# Patient Record
Sex: Female | Born: 1985 | Race: White | Hispanic: No | Marital: Married | Smoking: Former smoker
Health system: Southern US, Community
[De-identification: ages and names within clinical notes are randomized; demographics above are authoritative.]

## PROBLEM LIST (undated history)

## (undated) DIAGNOSIS — D649 Anemia, unspecified: Secondary | ICD-10-CM

## (undated) DIAGNOSIS — E669 Obesity, unspecified: Secondary | ICD-10-CM

## (undated) DIAGNOSIS — E282 Polycystic ovarian syndrome: Secondary | ICD-10-CM

## (undated) DIAGNOSIS — N390 Urinary tract infection, site not specified: Secondary | ICD-10-CM

## (undated) DIAGNOSIS — Z8719 Personal history of other diseases of the digestive system: Secondary | ICD-10-CM

## (undated) DIAGNOSIS — F419 Anxiety disorder, unspecified: Secondary | ICD-10-CM

## (undated) HISTORY — DX: Urinary tract infection, site not specified: N39.0

## (undated) HISTORY — DX: Obesity, unspecified: E66.9

## (undated) HISTORY — PX: WISDOM TOOTH EXTRACTION: SHX21

## (undated) HISTORY — DX: Anxiety disorder, unspecified: F41.9

## (undated) HISTORY — DX: Personal history of other diseases of the digestive system: Z87.19

## (undated) HISTORY — DX: Anemia, unspecified: D64.9

---

## 2005-10-26 ENCOUNTER — Encounter: Payer: Self-pay | Admitting: Emergency Medicine

## 2005-10-27 ENCOUNTER — Inpatient Hospital Stay (HOSPITAL_COMMUNITY): Admission: EM | Admit: 2005-10-27 | Discharge: 2005-10-27 | Payer: Self-pay

## 2017-09-23 ENCOUNTER — Encounter: Payer: Self-pay | Admitting: Obstetrics and Gynecology

## 2017-09-23 ENCOUNTER — Ambulatory Visit (INDEPENDENT_AMBULATORY_CARE_PROVIDER_SITE_OTHER): Payer: BC Managed Care – PPO | Admitting: Obstetrics and Gynecology

## 2017-09-23 VITALS — BP 125/72 | HR 68 | Ht 64.0 in | Wt 202.0 lb

## 2017-09-23 DIAGNOSIS — Z3687 Encounter for antenatal screening for uncertain dates: Secondary | ICD-10-CM | POA: Diagnosis not present

## 2017-09-23 DIAGNOSIS — Z113 Encounter for screening for infections with a predominantly sexual mode of transmission: Secondary | ICD-10-CM | POA: Diagnosis not present

## 2017-09-23 DIAGNOSIS — O34219 Maternal care for unspecified type scar from previous cesarean delivery: Secondary | ICD-10-CM | POA: Insufficient documentation

## 2017-09-23 DIAGNOSIS — Z3481 Encounter for supervision of other normal pregnancy, first trimester: Secondary | ICD-10-CM | POA: Diagnosis not present

## 2017-09-23 DIAGNOSIS — Z3480 Encounter for supervision of other normal pregnancy, unspecified trimester: Secondary | ICD-10-CM | POA: Insufficient documentation

## 2017-09-23 MED ORDER — DOXYLAMINE-PYRIDOXINE 10-10 MG PO TBEC: DELAYED_RELEASE_TABLET | ORAL | 6 refills | Status: DC

## 2017-09-23 NOTE — Progress Notes (Signed)
Last pap smear March 2018- normal results

## 2017-09-23 NOTE — Progress Notes (Signed)
  Subjective:    Joanna Banks is a [id number] 7660w0d being seen today for her first obstetrical visit.  Her obstetrical history is significant for 2 previous full term pregnancy and previous cesarean section followed by TOLAC. Patient does intend to breast feed. Pregnancy history fully reviewed.  Patient reports nausea.  Vitals:   09/23/17 1538 09/23/17 1541  BP: 125/72   Pulse: 68   Weight: 202 lb (91.6 kg)   Height:  5\' 4"  (1.626 m)    HISTORY: OB History  Gravida Para Term Preterm AB Living  3 2 2     2   SAB TAB Ectopic Multiple Live Births               # Outcome Date GA Lbr Len/2nd Weight Sex Delivery Anes PTL Lv  3 Current           2 Term      Vag-Spont     1 Term      CS-LTranv        History reviewed. No pertinent past medical history. Past Surgical History:  Procedure Laterality Date  . CESAREAN SECTION     Family History  Problem Relation Age of Onset  . Breast cancer Maternal Aunt   . Lung cancer Maternal Grandfather   . Lung cancer Paternal Grandfather      Exam    Uterus:   8-weeks  Pelvic Exam:    Perineum: No Hemorrhoids, Normal Perineum   Vulva: normal   Vagina:  normal mucosa, normal discharge   pH:    Cervix: multiparous appearance   Adnexa: normal adnexa and no mass, fullness, tenderness   Bony Pelvis: gynecoid  System: Breast:  normal appearance, no masses or tenderness   Skin: normal coloration and turgor, no rashes    Neurologic: oriented, no focal deficits   Extremities: normal strength, tone, and muscle mass   HEENT extra ocular movement intact   Mouth/Teeth mucous membranes moist, pharynx normal without lesions and dental hygiene good   Neck supple and no masses   Cardiovascular: regular rate and rhythm   Respiratory:  chest clear, no wheezing, crepitations, rhonchi, normal symmetric air entry   Abdomen: soft, non-tender; bowel sounds normal; no masses,  no organomegaly   Urinary:       Assessment:    Pregnancy:  [id number] Patient Active Problem List   Diagnosis Date Noted  . Supervision of other normal pregnancy, antepartum 09/23/2017  . Previous cesarean delivery affecting pregnancy, antepartum 09/23/2017        Plan:     Initial labs drawn. Prenatal vitamins. Problem list reviewed and updated. Genetic Screening discussed First Screen: ordered.  Ultrasound discussed; fetal survey: requested. Patient reports normal pap smear in march 2018- records requested Rx diclegis provided  Follow up in 4 weeks. 50% of 30 min visit spent on counseling and coordination of care.     Shaughn Thomley 09/23/2017

## 2017-09-23 NOTE — Progress Notes (Signed)
Bedside U/S shows IUP with FHT of 147 BPM and CRL 14.478mm  GA 268w5d

## 2017-09-24 LAB — HIV ANTIBODY (ROUTINE TESTING W REFLEX): HIV 1&2 Ab, 4th Generation: NONREACTIVE

## 2017-09-24 LAB — OBSTETRIC PANEL
ANTIBODY SCREEN: NOT DETECTED
BASOS ABS: 52 {cells}/uL (ref 0–200)
Basophils Relative: 0.4 %
EOS PCT: 0.5 %
Eosinophils Absolute: 65 cells/uL (ref 15–500)
HEMATOCRIT: 43.4 % (ref 35.0–45.0)
HEMOGLOBIN: 13.9 g/dL (ref 11.7–15.5)
HEP B S AG: NONREACTIVE
Lymphs Abs: 2903 cells/uL (ref 850–3900)
MCH: 26.6 pg — ABNORMAL LOW (ref 27.0–33.0)
MCHC: 32 g/dL (ref 32.0–36.0)
MCV: 83.1 fL (ref 80.0–100.0)
MONOS PCT: 5.2 %
MPV: 12.2 fL (ref 7.5–12.5)
NEUTROS ABS: 9211 {cells}/uL — AB (ref 1500–7800)
Neutrophils Relative %: 71.4 %
Platelets: 428 10*3/uL — ABNORMAL HIGH (ref 140–400)
RBC: 5.22 10*6/uL — ABNORMAL HIGH (ref 3.80–5.10)
RDW: 13.2 % (ref 11.0–15.0)
RPR Ser Ql: NONREACTIVE
TOTAL LYMPHOCYTE: 22.5 %
WBC mixed population: 671 cells/uL (ref 200–950)
WBC: 12.9 10*3/uL — ABNORMAL HIGH (ref 3.8–10.8)

## 2017-09-24 LAB — HEMOGLOBIN A1C
HEMOGLOBIN A1C: 4.9 %{Hb} (ref <5.7)
Mean Plasma Glucose: 94 (calc)
eAG (mmol/L): 5.2 (calc)

## 2017-09-25 LAB — GC/CHLAMYDIA PROBE AMP (~~LOC~~) NOT AT ARMC
CHLAMYDIA, DNA PROBE: NEGATIVE
Neisseria Gonorrhea: NEGATIVE

## 2017-09-25 LAB — CULTURE, OB URINE

## 2017-09-25 LAB — URINE CULTURE, OB REFLEX: ORGANISM ID, BACTERIA: NO GROWTH

## 2017-09-27 DIAGNOSIS — Z3480 Encounter for supervision of other normal pregnancy, unspecified trimester: Secondary | ICD-10-CM

## 2017-09-27 DIAGNOSIS — O34219 Maternal care for unspecified type scar from previous cesarean delivery: Secondary | ICD-10-CM

## 2017-09-29 ENCOUNTER — Other Ambulatory Visit: Payer: Self-pay | Admitting: Obstetrics and Gynecology

## 2017-09-29 DIAGNOSIS — Z3480 Encounter for supervision of other normal pregnancy, unspecified trimester: Secondary | ICD-10-CM

## 2017-10-19 NOTE — L&D Delivery Note (Addendum)
Delivery Note At 12:49 PM a viable female was delivered via VBAC, Spontaneous (Presentation: Cephalic;OA  ).  APGAR: 7, 9; weight 9 lb 14 oz (4479 g).   Placenta status:Complete with 3 vessel cord. with  3rd degree vaginal tear and some brisk post partum blood loss of >107300ml EBL: .  Cord pH: NA  Anesthesia:   Episiotomy: None Lacerations: 3rd degree (3A) perineal, sulcal Suture Repair: 1-0 & 1-0 vycrl and 3-0 Monocryl Est. Blood Loss (mL):  1651ml  During inspection there were sulcal tears and some active bleeders that were tamponaded with some sutures for hemostasis (running locking and Simple), hemostatsis was achieved and fundal massage was also done (good uterine tone), TXA was also given, bladder was straight cathed with Urine volume of 500ml. EBL was estimated at  1651ml, order set  For grade one EBL >103200ml initiated and CBC taken nearing end of the repair with a repeat for tomorrow at 0500. Also PO Fe BID prescribed with a  Laxative to start.  Patient was asymptomatic with stable vitals throughout the whole procedure and birthing process.  Mom to postpartum.  Baby to Couplet care / Skin to Skin.  Sandi Ravelingnson B Alvis 05/13/2018, 2:03 PM  OB FELLOW DELIVERY ATTESTATION I was gloved and present for the delivery in its entirety, and I agree with the above resident's note.    Dr. Vergie LivingPickens evaluated 3rd degree perineal laceration, and I repaired it. I repaired sulcal laceration with 20- Vycril with care to achieve hemostasis. Then interrupted sutures of 1-0 Vicryl were used to repair 3a perineal laceration. Pt still with significant bleeding from lacerations and tissues tearing easily, so Monocryl was used for remainder of perineal repair.   Measure QBL 1651 mL. TXA given. Hgb 10.8 from 11.9; will repeat in AM  Frederik PearJulie P Jannessa Ogden, MD OB Fellow 05/13/2018, 3:21 PM

## 2017-10-21 ENCOUNTER — Ambulatory Visit (INDEPENDENT_AMBULATORY_CARE_PROVIDER_SITE_OTHER): Payer: BC Managed Care – PPO | Admitting: Obstetrics & Gynecology

## 2017-10-21 DIAGNOSIS — Z8759 Personal history of other complications of pregnancy, childbirth and the puerperium: Secondary | ICD-10-CM

## 2017-10-21 DIAGNOSIS — Z3480 Encounter for supervision of other normal pregnancy, unspecified trimester: Secondary | ICD-10-CM

## 2017-10-21 MED ORDER — ONDANSETRON HCL 4 MG PO TABS: 4.0000 mg | ORAL_TABLET | Freq: Three times a day (TID) | ORAL | 1 refills | Status: DC | PRN

## 2017-10-21 NOTE — Progress Notes (Signed)
   PRENATAL VISIT NOTE  Subjective:  Joanna BarrackKatie E Banks is a 32 y.o. G3P2002 at [redacted]w[redacted]d being seen today for ongoing prenatal care.  She is currently monitored for the following issues for this low-risk pregnancy and has Supervision of other normal pregnancy, antepartum; Previous cesarean delivery affecting pregnancy, antepartum; and History of delivery of macrosomal infant on their problem list.  Patient reports nausea.  Contractions: Not present. Vag. Bleeding: None.  Movement: Absent. Denies leaking of fluid.   The following portions of the patient's history were reviewed and updated as appropriate: allergies, current medications, past family history, past medical history, past social history, past surgical history and problem list. Problem list updated.  Objective:   Vitals:   10/21/17 1553  BP: 119/78  Pulse: 76  Weight: 204 lb (92.5 kg)    Fetal Status: Fetal Heart Rate (bpm): 151   Movement: Absent     General:  Alert, oriented and cooperative. Patient is in no acute distress.  Skin: Skin is warm and dry. No rash noted.   Cardiovascular: Normal heart rate noted  Respiratory: Normal respiratory effort, no problems with respiration noted  Abdomen: Soft, gravid, appropriate for gestational age.  Pain/Pressure: Absent     Pelvic: Cervical exam deferred        Extremities: Normal range of motion.  Edema: None  Mental Status:  Normal mood and affect. Normal behavior. Normal judgment and thought content.   Assessment and Plan:  Pregnancy: G3P2002 at [redacted]w[redacted]d  1. Supervision of other normal pregnancy, antepartum - Firt screen  - US MFM OB DETAIL +14 WK; Future - Zofran for nausea  2. History of delivery of macrosomal infant Patient reports no problem with delivery.  Preterm labor symptoms and general obstetric precautions including but not limited to vaginal bleeding, contractions, leaking of fluid and fetal movement were reviewed in detail with the patient. Please refer to After  Visit Summary for other counseling recommendations.  Return in about 8 weeks (around 12/16/2017).   Elsie LincolnKelly Lujean Ebright, MD

## 2017-10-26 ENCOUNTER — Encounter (HOSPITAL_COMMUNITY): Payer: Self-pay | Admitting: Obstetrics and Gynecology

## 2017-10-28 ENCOUNTER — Ambulatory Visit (HOSPITAL_COMMUNITY)
Admission: RE | Admit: 2017-10-28 | Discharge: 2017-10-28 | Disposition: A | Discharge status: Home / Self Care | Payer: BC Managed Care – PPO | Source: Ambulatory Visit | Attending: Obstetrics and Gynecology | Admitting: Obstetrics and Gynecology

## 2017-10-28 ENCOUNTER — Encounter (HOSPITAL_COMMUNITY): Payer: Self-pay

## 2017-10-28 ENCOUNTER — Other Ambulatory Visit: Payer: Self-pay | Admitting: Obstetrics and Gynecology

## 2017-10-28 DIAGNOSIS — Z3481 Encounter for supervision of other normal pregnancy, first trimester: Secondary | ICD-10-CM

## 2017-10-28 DIAGNOSIS — Z3A13 13 weeks gestation of pregnancy: Secondary | ICD-10-CM

## 2017-10-28 DIAGNOSIS — O34219 Maternal care for unspecified type scar from previous cesarean delivery: Secondary | ICD-10-CM | POA: Diagnosis not present

## 2017-10-28 DIAGNOSIS — O09291 Supervision of pregnancy with other poor reproductive or obstetric history, first trimester: Secondary | ICD-10-CM | POA: Insufficient documentation

## 2017-10-28 DIAGNOSIS — Z3480 Encounter for supervision of other normal pregnancy, unspecified trimester: Secondary | ICD-10-CM

## 2017-10-28 DIAGNOSIS — Z3682 Encounter for antenatal screening for nuchal translucency: Secondary | ICD-10-CM | POA: Insufficient documentation

## 2017-10-28 DIAGNOSIS — O09299 Supervision of pregnancy with other poor reproductive or obstetric history, unspecified trimester: Secondary | ICD-10-CM

## 2017-10-28 DIAGNOSIS — E669 Obesity, unspecified: Secondary | ICD-10-CM | POA: Diagnosis not present

## 2017-10-28 DIAGNOSIS — O99211 Obesity complicating pregnancy, first trimester: Secondary | ICD-10-CM

## 2017-10-28 HISTORY — DX: Polycystic ovarian syndrome: E28.2

## 2017-11-04 ENCOUNTER — Other Ambulatory Visit: Payer: Self-pay

## 2017-11-08 ENCOUNTER — Encounter: Payer: Self-pay | Admitting: *Deleted

## 2017-12-08 DIAGNOSIS — E559 Vitamin D deficiency, unspecified: Secondary | ICD-10-CM | POA: Insufficient documentation

## 2017-12-09 ENCOUNTER — Other Ambulatory Visit: Payer: Self-pay | Admitting: Obstetrics & Gynecology

## 2017-12-09 ENCOUNTER — Ambulatory Visit (HOSPITAL_COMMUNITY)
Admission: RE | Admit: 2017-12-09 | Discharge: 2017-12-09 | Disposition: A | Discharge status: Home / Self Care | Payer: BC Managed Care – PPO | Source: Ambulatory Visit | Attending: Obstetrics & Gynecology | Admitting: Obstetrics & Gynecology

## 2017-12-09 ENCOUNTER — Encounter (HOSPITAL_COMMUNITY): Payer: Self-pay

## 2017-12-09 DIAGNOSIS — Z98891 History of uterine scar from previous surgery: Secondary | ICD-10-CM | POA: Diagnosis not present

## 2017-12-09 DIAGNOSIS — O99212 Obesity complicating pregnancy, second trimester: Secondary | ICD-10-CM | POA: Diagnosis not present

## 2017-12-09 DIAGNOSIS — O34219 Maternal care for unspecified type scar from previous cesarean delivery: Secondary | ICD-10-CM

## 2017-12-09 DIAGNOSIS — Z3689 Encounter for other specified antenatal screening: Secondary | ICD-10-CM | POA: Diagnosis present

## 2017-12-09 DIAGNOSIS — O09299 Supervision of pregnancy with other poor reproductive or obstetric history, unspecified trimester: Secondary | ICD-10-CM

## 2017-12-09 DIAGNOSIS — Z3480 Encounter for supervision of other normal pregnancy, unspecified trimester: Secondary | ICD-10-CM

## 2017-12-09 DIAGNOSIS — Z3A19 19 weeks gestation of pregnancy: Secondary | ICD-10-CM | POA: Diagnosis not present

## 2017-12-20 ENCOUNTER — Ambulatory Visit (INDEPENDENT_AMBULATORY_CARE_PROVIDER_SITE_OTHER): Payer: BC Managed Care – PPO | Admitting: Obstetrics & Gynecology

## 2017-12-20 VITALS — BP 127/66 | HR 81 | Wt 218.0 lb

## 2017-12-20 DIAGNOSIS — Z3400 Encounter for supervision of normal first pregnancy, unspecified trimester: Secondary | ICD-10-CM

## 2017-12-20 DIAGNOSIS — Z3480 Encounter for supervision of other normal pregnancy, unspecified trimester: Secondary | ICD-10-CM

## 2017-12-20 NOTE — Progress Notes (Signed)
   PRENATAL VISIT NOTE  Subjective:  Joanna Banks is a 32 y.o. G3P2002 at 6636w4d being seen today for ongoing prenatal care.  She is currently monitored for the following issues for this low-risk pregnancy and has Supervision of other normal pregnancy, antepartum; Previous cesarean delivery affecting pregnancy, antepartum; History of delivery of macrosomal infant; and Vitamin D deficiency on their problem list.  Patient reports no complaints.  Contractions: Not present. Vag. Bleeding: None.  Movement: Present. Denies leaking of fluid.   The following portions of the patient's history were reviewed and updated as appropriate: allergies, current medications, past family history, past medical history, past social history, past surgical history and problem list. Problem list updated.  Objective:   Vitals:   12/20/17 1548  BP: 127/66  Pulse: 81  Weight: 218 lb (98.9 kg)    Fetal Status: Fetal Heart Rate (bpm): 143   Movement: Present     General:  Alert, oriented and cooperative. Patient is in no acute distress.  Skin: Skin is warm and dry. No rash noted.   Cardiovascular: Normal heart rate noted  Respiratory: Normal respiratory effort, no problems with respiration noted  Abdomen: Soft, gravid, appropriate for gestational age.  Pain/Pressure: Absent     Pelvic: Cervical exam deferred        Extremities: Normal range of motion.  Edema: None  Mental Status:  Normal mood and affect. Normal behavior. Normal judgment and thought content.   Assessment and Plan:  Pregnancy: G3P2002 at 6036w4d  1. Supervision of normal first pregnancy, antepartum  - Alpha fetoprotein, maternal - Baby scripts compliant--all the values are flowing.    Preterm labor symptoms and general obstetric precautions including but not limited to vaginal bleeding, contractions, leaking of fluid and fetal movement were reviewed in detail with the patient. Please refer to After Visit Summary for other counseling  recommendations.     Elsie LincolnKelly Rease Wence, MD

## 2018-01-04 LAB — TIQ-AOE

## 2018-01-04 LAB — ALPHA FETOPROTEIN, MATERNAL

## 2018-01-05 LAB — ALPHA FETOPROTEIN, MATERNAL
AFP MoM: 0.88
AFP, SERUM: 42.2 ng/mL
Calc'd Gestational Age: 20.6 weeks
Maternal Wt: 218 [lb_av]
TWINS-AFP: 1

## 2018-02-07 ENCOUNTER — Encounter: Payer: Self-pay | Admitting: Advanced Practice Midwife

## 2018-02-07 ENCOUNTER — Other Ambulatory Visit (INDEPENDENT_AMBULATORY_CARE_PROVIDER_SITE_OTHER): Payer: BC Managed Care – PPO | Admitting: Advanced Practice Midwife

## 2018-02-07 VITALS — BP 112/69 | HR 81 | Wt 227.0 lb

## 2018-02-07 DIAGNOSIS — Z23 Encounter for immunization: Secondary | ICD-10-CM

## 2018-02-07 DIAGNOSIS — O34219 Maternal care for unspecified type scar from previous cesarean delivery: Secondary | ICD-10-CM

## 2018-02-07 DIAGNOSIS — Z3480 Encounter for supervision of other normal pregnancy, unspecified trimester: Secondary | ICD-10-CM

## 2018-02-07 DIAGNOSIS — Z8759 Personal history of other complications of pregnancy, childbirth and the puerperium: Secondary | ICD-10-CM

## 2018-02-07 DIAGNOSIS — Z3482 Encounter for supervision of other normal pregnancy, second trimester: Secondary | ICD-10-CM

## 2018-02-07 NOTE — Patient Instructions (Signed)

## 2018-02-07 NOTE — Progress Notes (Signed)
   PRENATAL VISIT NOTE  Subjective:  Joanna Banks is a 32 y.o. G3P2002 at 5362w4d being seen today for ongoing prenatal care.  She is currently monitored for the following issues for this high-risk pregnancy and has Supervision of other normal pregnancy, antepartum; Previous cesarean delivery affecting pregnancy, antepartum; History of delivery of macrosomal infant; and Vitamin D deficiency on their problem list.  Patient reports Some hip pain, Glucola today.  Contractions: Not present. Vag. Bleeding: None.  Movement: Present. Denies leaking of fluid.   The following portions of the patient's history were reviewed and updated as appropriate: allergies, current medications, past family history, past medical history, past social history, past surgical history and problem list. Problem list updated.  Objective:   Vitals:   02/07/18 0854  BP: 112/69  Pulse: 81  Weight: 227 lb (103 kg)    Fetal Status: Fetal Heart Rate (bpm): 141 Fundal Height: 28 cm Movement: Present     General:  Alert, oriented and cooperative. Patient is in no acute distress.  Skin: Skin is warm and dry. No rash noted.   Cardiovascular: Normal heart rate noted  Respiratory: Normal respiratory effort, no problems with respiration noted  Abdomen: Soft, gravid, appropriate for gestational age.  Pain/Pressure: Absent     Pelvic: Cervical exam deferred        Extremities: Normal range of motion.  Edema: None  Mental Status: Normal mood and affect. Normal behavior. Normal judgment and thought content.   Assessment and Plan:  Pregnancy: G3P2002 at 6562w4d  1. Supervision of other normal pregnancy, antepartum  - HIV antibody (with reflex) - CBC - RPR - 2Hr GTT w/ 1 Hr Carpenter 75 g - Tdap vaccine greater than or equal to 7yo IM  2. History of delivery of macrosomal infant      Does describe shoulder dystocia maneuvers and 3rd degree lac \    Discussed CHO modification even if not diabetic (limit sugar, simple carb)      May consider membrane sweeping (had it done at 40 wks and delivered at 6641)  Preterm labor symptoms and general obstetric precautions including but not limited to vaginal bleeding, contractions, leaking of fluid and fetal movement were reviewed in detail with the patient. Please refer to After Visit Summary for other counseling recommendations.  ROT 2 weeks  Future Appointments  Date Time Provider Department Center  03/07/2018  4:00 PM Allie Bossierove, Myra C, MD [facility]    Wynelle BourgeoisMarie Kaseem Vastine, CNM

## 2018-02-07 NOTE — Progress Notes (Deleted)
   PRENATAL VISIT NOTE  Subjective:  Joanna Banks is a 32 y.o. G3P2002 at 3422w4d being seen today for ongoing prenatal care.  She is currently monitored for the following issues for this high-risk pregnancy and has Supervision of other normal pregnancy, antepartum; Previous cesarean delivery affecting pregnancy, antepartum; History of delivery of macrosomal infant; and Vitamin D deficiency on their problem list.  Patient reports Hip Pain, more like cramps in hip.  Contractions: Not present. Vag. Bleeding: None.  Movement: Present. Denies leaking of fluid.   The following portions of the patient's history were reviewed and updated as appropriate: allergies, current medications, past family history, past medical history, past social history, past surgical history and problem list. Problem list updated.  Objective:   Vitals:   02/07/18 0854  BP: 112/69  Pulse: 81  Weight: 227 lb (103 kg)    Fetal Status: Fetal Heart Rate (bpm): 141 Fundal Height: 28 cm Movement: Present     General:  Alert, oriented and cooperative. Patient is in no acute distress.  Skin: Skin is warm and dry. No rash noted.   Cardiovascular: Normal heart rate noted  Respiratory: Normal respiratory effort, no problems with respiration noted  Abdomen: Soft, gravid, appropriate for gestational age.  Pain/Pressure: Absent     Pelvic: Cervical exam deferred        Extremities: Normal range of motion.  Edema: None  Mental Status: Normal mood and affect. Normal behavior. Normal judgment and thought content.   Assessment and Plan:  Pregnancy: G3P2002 at 1322w4d  1. Supervision of other normal pregnancy, antepartum  - HIV antibody (with reflex) - CBC - RPR - 2Hr GTT w/ 1 Hr Carpenter 75 g - Tdap vaccine greater than or equal to 7yo IM  2. History of delivery of macrosomal infant 10 pound, 10 ounce vaginal delivery [place]. Does describe what sounds like shoulder dystocia, SP pressure, and 3rd degree lac Discussed  carb modified diet, even if not diabetic, limit sugar and simple carbs   Preterm labor symptoms and general obstetric precautions including but not limited to vaginal bleeding, contractions, leaking of fluid and fetal movement were reviewed in detail with the patient. Please refer to After Visit Summary for other counseling recommendations.  Per babyscripts plan  Future Appointments  Date Time Provider Department Center  03/07/2018  4:00 PM Allie Bossierove, Myra C, MD [facility]    Wynelle BourgeoisMarie Ramsie Ostrander, CNM

## 2018-02-08 LAB — 2HR GTT W 1 HR, CARPENTER, 75 G
Glucose, 1 Hr, Gest: 124 mg/dL (ref 65–179)
Glucose, 2 Hr, Gest: 114 mg/dL (ref 65–152)
Glucose, Fasting, Gest: 79 mg/dL (ref 65–91)

## 2018-02-08 LAB — CBC
HCT: 36.7 % (ref 35.0–45.0)
Hemoglobin: 12.1 g/dL (ref 11.7–15.5)
MCH: 28.1 pg (ref 27.0–33.0)
MCHC: 33 g/dL (ref 32.0–36.0)
MCV: 85.2 fL (ref 80.0–100.0)
MPV: 12.3 fL (ref 7.5–12.5)
Platelets: 305 10*3/uL (ref 140–400)
RBC: 4.31 10*6/uL (ref 3.80–5.10)
RDW: 12.3 % (ref 11.0–15.0)
WBC: 14 10*3/uL — AB (ref 3.8–10.8)

## 2018-02-08 LAB — RPR: RPR Ser Ql: NONREACTIVE

## 2018-02-08 LAB — HIV ANTIBODY (ROUTINE TESTING W REFLEX): HIV 1&2 Ab, 4th Generation: NONREACTIVE

## 2018-02-14 ENCOUNTER — Encounter: Payer: BC Managed Care – PPO | Admitting: Obstetrics & Gynecology

## 2018-03-07 ENCOUNTER — Ambulatory Visit (INDEPENDENT_AMBULATORY_CARE_PROVIDER_SITE_OTHER): Payer: BC Managed Care – PPO | Admitting: Obstetrics & Gynecology

## 2018-03-07 VITALS — BP 118/68 | HR 78 | Wt 236.0 lb

## 2018-03-07 DIAGNOSIS — O34219 Maternal care for unspecified type scar from previous cesarean delivery: Secondary | ICD-10-CM

## 2018-03-07 DIAGNOSIS — Z3480 Encounter for supervision of other normal pregnancy, unspecified trimester: Secondary | ICD-10-CM

## 2018-03-07 DIAGNOSIS — Z3483 Encounter for supervision of other normal pregnancy, third trimester: Secondary | ICD-10-CM

## 2018-03-07 NOTE — Progress Notes (Signed)
   PRENATAL VISIT NOTE  Subjective:  Joanna Banks is a 32 y.o. G3P2002 at [redacted]w[redacted]d being seen today for ongoing prenatal care.  She is currently monitored for the following issues for this low-risk pregnancy and has Supervision of other normal pregnancy, antepartum; Previous cesarean delivery affecting pregnancy, antepartum; History of delivery of macrosomal infant; and Vitamin D deficiency on their problem list.  Patient reports no complaints.  Contractions: Not present. Vag. Bleeding: None.  Movement: Present. Denies leaking of fluid.   The following portions of the patient's history were reviewed and updated as appropriate: allergies, current medications, past family history, past medical history, past social history, past surgical history and problem list. Problem list updated.  Objective:   Vitals:   03/07/18 1605  BP: 118/68  Pulse: 78  Weight: 236 lb (107 kg)    Fetal Status: Fetal Heart Rate (bpm): 132   Movement: Present     General:  Alert, oriented and cooperative. Patient is in no acute distress.  Skin: Skin is warm and dry. No rash noted.   Cardiovascular: Normal heart rate noted  Respiratory: Normal respiratory effort, no problems with respiration noted  Abdomen: Soft, gravid, appropriate for gestational age.  Pain/Pressure: Absent     Pelvic: Cervical exam deferred        Extremities: Normal range of motion.  Edema: None  Mental Status: Normal mood and affect. Normal behavior. Normal judgment and thought content.   Assessment and Plan:  Pregnancy: G3P2002 at [redacted]w[redacted]d  1. Previous cesarean delivery affecting pregnancy, antepartum - Plans TOLAC, has had a VBAC  2. Supervision of other normal pregnancy, antepartum - u/s ordered for fetal size at about 37 weeks   Preterm labor symptoms and general obstetric precautions including but not limited to vaginal bleeding, contractions, leaking of fluid and fetal movement were reviewed in detail with the patient. Please refer  to After Visit Summary for other counseling recommendations.  No follow-ups on file.  No future appointments.  Allie Bossier, MD

## 2018-03-31 ENCOUNTER — Encounter (INDEPENDENT_AMBULATORY_CARE_PROVIDER_SITE_OTHER): Payer: Self-pay

## 2018-04-07 ENCOUNTER — Ambulatory Visit (INDEPENDENT_AMBULATORY_CARE_PROVIDER_SITE_OTHER): Payer: BC Managed Care – PPO | Admitting: Obstetrics & Gynecology

## 2018-04-07 VITALS — BP 132/70 | HR 84 | Wt 246.0 lb

## 2018-04-07 DIAGNOSIS — Z113 Encounter for screening for infections with a predominantly sexual mode of transmission: Secondary | ICD-10-CM

## 2018-04-07 DIAGNOSIS — Z3483 Encounter for supervision of other normal pregnancy, third trimester: Secondary | ICD-10-CM

## 2018-04-07 DIAGNOSIS — Z3480 Encounter for supervision of other normal pregnancy, unspecified trimester: Secondary | ICD-10-CM

## 2018-04-07 LAB — OB RESULTS CONSOLE GBS: GBS: NEGATIVE

## 2018-04-07 NOTE — Progress Notes (Signed)
   PRENATAL VISIT NOTE  Subjective:  Joanna Banks is a 32 y.o. G3P2002 at 3427w0d being seen today for ongoing prenatal care.  She is currently monitored for the following issues for this low-risk pregnancy and has Supervision of other normal pregnancy, antepartum; Previous cesarean delivery affecting pregnancy, antepartum; History of delivery of macrosomal infant; and Vitamin D deficiency on their problem list.  Patient reports no complaints.  Contractions: Not present. Vag. Bleeding: None.  Movement: Present. Denies leaking of fluid.   The following portions of the patient's history were reviewed and updated as appropriate: allergies, current medications, past family history, past medical history, past social history, past surgical history and problem list. Problem list updated.  Objective:   Vitals:   04/07/18 1400  BP: 132/70  Pulse: 84  Weight: 246 lb (111.6 kg)    Fetal Status: Fetal Heart Rate (bpm): 151   Movement: Present     General:  Alert, oriented and cooperative. Patient is in no acute distress.  Skin: Skin is warm and dry. No rash noted.   Cardiovascular: Normal heart rate noted  Respiratory: Normal respiratory effort, no problems with respiration noted  Abdomen: Soft, gravid, appropriate for gestational age.  Pain/Pressure: Absent     Pelvic: Cervical exam performed      cl/thick/high/posterior  Extremities: Normal range of motion.  Edema: Trace  Mental Status: Normal mood and affect. Normal behavior. Normal judgment and thought content.   Assessment and Plan:  Pregnancy: G3P2002 at 4627w0d  1. Supervision of other normal pregnancy, antepartum -US for growth between 37-38 weeks (hx 10 pound 10 ounce VBAC) - Culture, beta strep (group b only) - Urine cytology ancillary only - Baby Rx compliant.  Nml BPs flowing into chart.   Term labor symptoms and general obstetric precautions including but not limited to vaginal bleeding, contractions, leaking of fluid and fetal  movement were reviewed in detail with the patient. Please refer to After Visit Summary for other counseling recommendations.  Return in about 2 weeks (around 04/21/2018).   Elsie LincolnKelly Trennon Torbeck, MD

## 2018-04-10 LAB — CULTURE, BETA STREP (GROUP B ONLY)
MICRO NUMBER: 90740217
SPECIMEN QUALITY: ADEQUATE

## 2018-04-11 ENCOUNTER — Ambulatory Visit (HOSPITAL_COMMUNITY)
Admission: RE | Admit: 2018-04-11 | Discharge: 2018-04-11 | Disposition: A | Discharge status: Home / Self Care | Payer: BC Managed Care – PPO | Source: Ambulatory Visit | Attending: Obstetrics & Gynecology | Admitting: Obstetrics & Gynecology

## 2018-04-11 DIAGNOSIS — O09299 Supervision of pregnancy with other poor reproductive or obstetric history, unspecified trimester: Secondary | ICD-10-CM | POA: Diagnosis not present

## 2018-04-11 DIAGNOSIS — Z3A36 36 weeks gestation of pregnancy: Secondary | ICD-10-CM | POA: Insufficient documentation

## 2018-04-11 DIAGNOSIS — Z3480 Encounter for supervision of other normal pregnancy, unspecified trimester: Secondary | ICD-10-CM

## 2018-04-11 LAB — URINE CYTOLOGY ANCILLARY ONLY
CHLAMYDIA, DNA PROBE: NEGATIVE
Neisseria Gonorrhea: NEGATIVE

## 2018-04-22 ENCOUNTER — Ambulatory Visit (INDEPENDENT_AMBULATORY_CARE_PROVIDER_SITE_OTHER): Payer: BC Managed Care – PPO | Admitting: Advanced Practice Midwife

## 2018-04-22 ENCOUNTER — Encounter: Payer: Self-pay | Admitting: Advanced Practice Midwife

## 2018-04-22 VITALS — BP 125/75 | HR 87 | Wt 246.0 lb

## 2018-04-22 DIAGNOSIS — O34219 Maternal care for unspecified type scar from previous cesarean delivery: Secondary | ICD-10-CM

## 2018-04-22 NOTE — Progress Notes (Incomplete)
   PRENATAL VISIT NOTE  Subjective:  Joanna Banks is a 32 y.o. G3P2002 at 4660w1d being seen today for ongoing prenatal care.  She is currently monitored for the following issues for this {Blank single:19197::"high-risk","low-risk"} pregnancy and has Supervision of other normal pregnancy, antepartum; Previous cesarean delivery affecting pregnancy, antepartum; and History of delivery of macrosomal infant on their problem list.  Patient reports {sx:14538}.  Contractions: Irregular. Vag. Bleeding: None.  Movement: Present. Denies leaking of fluid.   The following portions of the patient's history were reviewed and updated as appropriate: allergies, current medications, past family history, past medical history, past social history, past surgical history and problem list. Problem list updated.  Objective:   Vitals:   04/22/18 0903  BP: 125/75  Pulse: 87  Weight: 246 lb (111.6 kg)    Fetal Status: Fetal Heart Rate (bpm): 136   Movement: Present  Presentation: Vertex  General:  Alert, oriented and cooperative. Patient is in no acute distress.  Skin: Skin is warm and dry. No rash noted.   Cardiovascular: Normal heart rate noted  Respiratory: Normal respiratory effort, no problems with respiration noted  Abdomen: Soft, gravid, appropriate for gestational age.  Pain/Pressure: Present     Pelvic: {Blank single:19197::"Cervical exam performed","Cervical exam deferred"} Dilation: 1.5 Effacement (%): 60 Station: -2  Extremities: Normal range of motion.  Edema: Trace  Mental Status: Normal mood and affect. Normal behavior. Normal judgment and thought content.   Assessment and Plan:  Pregnancy: G3P2002 at 1560w1d  1. Previous cesarean delivery affecting pregnancy, antepartum ***  {Blank single:19197::"Term","Preterm"} labor symptoms and general obstetric precautions including but not limited to vaginal bleeding, contractions, leaking of fluid and fetal movement were reviewed in detail with the  patient. Please refer to After Visit Summary for other counseling recommendations.  Return in about 1 week (around 04/29/2018[place] [facility]  Future Appointments  Date Time Provider Department Center  05/02/2018  9:30 AM Aviva SignsWilliams, Letroy Vazguez L, CNM [facility]    Wynelle BourgeoisMarie Hamilton Marinello, CNM

## 2018-04-22 NOTE — Patient Instructions (Signed)
Vaginal Birth After Cesarean Delivery Vaginal birth after cesarean delivery (VBAC) is giving birth vaginally after previously delivering a baby by a cesarean. In the past, if a woman had a cesarean delivery, all births afterward would be done by cesarean delivery. This is no longer true. It can be safe for the mother to try a vaginal delivery after having a cesarean delivery. It is important to discuss VBAC with your health care provider early in the pregnancy so you can understand the risks, benefits, and options. It will give you time to decide what is best in your particular case. The final decision about whether to have a VBAC or repeat cesarean delivery should be between you and your health care provider. Any changes in your health or your baby's health during your pregnancy may make it necessary to change your initial decision about VBAC. Women who plan to have a VBAC should check with their health care provider to be sure that:  The previous cesarean delivery was done with a low transverse uterine cut (incision) (not a vertical classical incision).  The birth canal is big enough for the baby.  There were no other operations on the uterus.  An electronic fetal monitor (EFM) will be on at all times during labor.  An operating room will be available and ready in case an emergency cesarean delivery is needed.  A health care provider and surgical nursing staff will be available at all times during labor to be ready to do an emergency delivery cesarean if necessary.  An anesthesiologist will be present in case an emergency cesarean delivery is needed.  The nursery is prepared and has adequate personnel and necessary equipment available to care for the baby in case of an emergency cesarean delivery. Benefits of VBAC  Shorter stay in the hospital.  Avoidance of risks associated with cesarean delivery, such as: ? Surgical complications, such as opening of the incision or hernia in the  incision. ? Injury to other organs. ? Fever. This can occur if an infection develops after surgery. It can also occur as a reaction to the medicine given to make you numb during the surgery.  Less blood loss and need for blood transfusions.  Lower risk of blood clots and infection.  Shorter recovery.  Decreased risk for having to remove the uterus (hysterectomy).  Decreased risk for the placenta to completely or partially cover the opening of the uterus (placenta previa) with a future pregnancy.  Decrease risk in future labor and delivery. Risks of a VBAC  Tearing (rupture) of the uterus. This is occurs in less than 1% of VBACs. The risk of this happening is higher if: ? Steps are taken to begin the labor process (induce labor) or stimulate or strengthen contractions (augment labor). ? Medicine is used to soften (ripen) the cervix.  Having to remove the uterus (hysterectomy) if it ruptures. VBAC should not be done if:  The previous cesarean delivery was done with a vertical (classical) or T-shaped incision or you do not know what kind of incision was made.  You had a ruptured uterus.  You have had certain types of surgery on your uterus, such as removal of uterine fibroids. Ask your health care provider about other types of surgeries that prevent you from having a VBAC.  You have certain medical or childbirth (obstetrical) problems.  There are problems with the baby.  You have had two previous cesarean deliveries and no vaginal deliveries. Other facts to know about VBAC:  It   is safe to have an epidural anesthetic with VBAC.  It is safe to turn the baby from a breech position (attempt an external cephalic version).  It is safe to try a VBAC with twins.  VBAC may not be successful if your baby weights 8.8 lb (4 kg) or more. However, weight predictions are not always accurate and should not be used alone to decide if VBAC is right for you.  There is an increased failure rate  if the time between the cesarean delivery and VBAC is less than 19 months.  Your health care provider may advise against a VBAC if you have preeclampsia (high blood pressure, protein in the urine, and swelling of face and extremities).  VBAC is often successful if you previously gave birth vaginally.  VBAC is often successful when the labor starts spontaneously before the due date.  Delivering a baby through a VBAC is similar to having a normal spontaneous vaginal delivery. This information is not intended to replace advice given to you by your health care provider. Make sure you discuss any questions you have with your health care provider. Document Released: 03/28/2007 Document Revised: 03/12/2016 Document Reviewed: 05/04/2013 Elsevier Interactive Patient Education  2018 Elsevier Inc.  

## 2018-04-22 NOTE — Progress Notes (Signed)
   PRENATAL VISIT NOTE  Subjective:  Joanna Banks is a 32 y.o. G3P2002 at 3998w1d being seen today for ongoing prenatal care.  She is currently monitored for the following issues for this low-risk pregnancy and has Supervision of other normal pregnancy, antepartum; Previous cesarean delivery affecting pregnancy, antepartum; and History of delivery of macrosomal infant on their problem list.  Patient reports occasional contractions.  Contractions: Irregular. Vag. Bleeding: None.  Movement: Present. Denies leaking of fluid.   The following portions of the patient's history were reviewed and updated as appropriate: allergies, current medications, past family history, past medical history, past social history, past surgical history and problem list. Problem list updated.  Objective:   Vitals:   04/22/18 0903  BP: 125/75  Pulse: 87  Weight: 246 lb (111.6 kg)    Fetal Status: Fetal Heart Rate (bpm): 136   Movement: Present  Presentation: Vertex  General:  Alert, oriented and cooperative. Patient is in no acute distress.  Skin: Skin is warm and dry. No rash noted.   Cardiovascular: Normal heart rate noted  Respiratory: Normal respiratory effort, no problems with respiration noted  Abdomen: Soft, gravid, appropriate for gestational age.  Pain/Pressure: Present     Pelvic: Cervical exam performed Dilation: 1.5 Effacement (%): 60 Station: -2  Extremities: Normal range of motion.  Edema: Trace  Mental Status: Normal mood and affect. Normal behavior. Normal judgment and thought content.   Assessment and Plan:  Pregnancy: G3P2002 at 5098w1d  1. Previous cesarean delivery affecting pregnancy, antepartum      Membranes swept per request      Discussed IOL process  (if necessary)   States last doctor would not use Pitocin because she was a TOLAC. Discussed we would use it, but not cytotec.        Discussed may continue working out, but precautions reviewed      May want sweeping again next  visit  Term labor symptoms and general obstetric precautions including but not limited to vaginal bleeding, contractions, leaking of fluid and fetal movement were reviewed in detail with the patient. Please refer to After Visit Summary for other counseling recommendations.  Return in about 1 week (around 04/29/2018) [place] [facility]  Future Appointments  Date Time Provider Department Center  05/02/2018  9:30 AM Aviva SignsWilliams, Lizzy Hamre L, CNM [facility]    Wynelle BourgeoisMarie Geffrey Michaelsen, CNM

## 2018-05-02 ENCOUNTER — Encounter: Payer: Self-pay | Admitting: Advanced Practice Midwife

## 2018-05-02 ENCOUNTER — Ambulatory Visit (INDEPENDENT_AMBULATORY_CARE_PROVIDER_SITE_OTHER): Payer: BC Managed Care – PPO | Admitting: Advanced Practice Midwife

## 2018-05-02 VITALS — BP 132/77 | HR 90 | Wt 249.0 lb

## 2018-05-02 DIAGNOSIS — O34219 Maternal care for unspecified type scar from previous cesarean delivery: Secondary | ICD-10-CM

## 2018-05-02 DIAGNOSIS — Z8759 Personal history of other complications of pregnancy, childbirth and the puerperium: Secondary | ICD-10-CM

## 2018-05-02 NOTE — Progress Notes (Deleted)
   PRENATAL VISIT NOTE  Subjective:  Joanna Banks is a 32 y.o. G3P2002 at 6961w4d being seen today for ongoing prenatal care.  She is currently monitored for the following issues for this {Blank single:19197::"high-risk","low-risk"} pregnancy and has Supervision of other normal pregnancy, antepartum; Previous cesarean delivery affecting pregnancy, antepartum; and History of delivery of macrosomal infant on their problem list.  Patient reports {sx:14538}.  Contractions: Irregular. Vag. Bleeding: None.  Movement: Present. Denies leaking of fluid.   The following portions of the patient's history were reviewed and updated as appropriate: allergies, current medications, past family history, past medical history, past social history, past surgical history and problem list. Problem list updated.  Objective:   Vitals:   05/02/18 0934  BP: 132/77  Pulse: 90  Weight: 249 lb (112.9 kg)    Fetal Status: Fetal Heart Rate (bpm): 145   Movement: Present     General:  Alert, oriented and cooperative. Patient is in no acute distress.  Skin: Skin is warm and dry. No rash noted.   Cardiovascular: Normal heart rate noted  Respiratory: Normal respiratory effort, no problems with respiration noted  Abdomen: Soft, gravid, appropriate for gestational age.  Pain/Pressure: Present     Pelvic: {Blank single:19197::"Cervical exam performed","Cervical exam deferred"}        Extremities: Normal range of motion.  Edema: Trace  Mental Status: Normal mood and affect. Normal behavior. Normal judgment and thought content.   Assessment and Plan:  Pregnancy: G3P2002 at 7361w4d  1. Previous cesarean delivery affecting pregnancy, antepartum ***  2. History of delivery of macrosomal infant ***  {Blank single:19197::"Term","Preterm"} labor symptoms and general obstetric precautions including but not limited to vaginal bleeding, contractions, leaking of fluid and fetal movement were reviewed in detail with the  patient. Please refer to After Visit Summary for other counseling recommendations.  Return in about 1 week (around 05/09/2018) [place] [facility] [facility].  Future Appointments  Date Time Provider Department Center  05/09/2018  9:15 AM Sharyon Cableogers, Veronica C, CNM [facility]    Wynelle BourgeoisMarie Jadea Shiffer, CNM

## 2018-05-02 NOTE — Patient Instructions (Signed)
Vaginal Birth After Cesarean Delivery Vaginal birth after cesarean delivery (VBAC) is giving birth vaginally after previously delivering a baby by a cesarean. In the past, if a woman had a cesarean delivery, all births afterward would be done by cesarean delivery. This is no longer true. It can be safe for the mother to try a vaginal delivery after having a cesarean delivery. It is important to discuss VBAC with your health care provider early in the pregnancy so you can understand the risks, benefits, and options. It will give you time to decide what is best in your particular case. The final decision about whether to have a VBAC or repeat cesarean delivery should be between you and your health care provider. Any changes in your health or your baby's health during your pregnancy may make it necessary to change your initial decision about VBAC. Women who plan to have a VBAC should check with their health care provider to be sure that:  The previous cesarean delivery was done with a low transverse uterine cut (incision) (not a vertical classical incision).  The birth canal is big enough for the baby.  There were no other operations on the uterus.  An electronic fetal monitor (EFM) will be on at all times during labor.  An operating room will be available and ready in case an emergency cesarean delivery is needed.  A health care provider and surgical nursing staff will be available at all times during labor to be ready to do an emergency delivery cesarean if necessary.  An anesthesiologist will be present in case an emergency cesarean delivery is needed.  The nursery is prepared and has adequate personnel and necessary equipment available to care for the baby in case of an emergency cesarean delivery. Benefits of VBAC  Shorter stay in the hospital.  Avoidance of risks associated with cesarean delivery, such as: ? Surgical complications, such as opening of the incision or hernia in the  incision. ? Injury to other organs. ? Fever. This can occur if an infection develops after surgery. It can also occur as a reaction to the medicine given to make you numb during the surgery.  Less blood loss and need for blood transfusions.  Lower risk of blood clots and infection.  Shorter recovery.  Decreased risk for having to remove the uterus (hysterectomy).  Decreased risk for the placenta to completely or partially cover the opening of the uterus (placenta previa) with a future pregnancy.  Decrease risk in future labor and delivery. Risks of a VBAC  Tearing (rupture) of the uterus. This is occurs in less than 1% of VBACs. The risk of this happening is higher if: ? Steps are taken to begin the labor process (induce labor) or stimulate or strengthen contractions (augment labor). ? Medicine is used to soften (ripen) the cervix.  Having to remove the uterus (hysterectomy) if it ruptures. VBAC should not be done if:  The previous cesarean delivery was done with a vertical (classical) or T-shaped incision or you do not know what kind of incision was made.  You had a ruptured uterus.  You have had certain types of surgery on your uterus, such as removal of uterine fibroids. Ask your health care provider about other types of surgeries that prevent you from having a VBAC.  You have certain medical or childbirth (obstetrical) problems.  There are problems with the baby.  You have had two previous cesarean deliveries and no vaginal deliveries. Other facts to know about VBAC:  It   is safe to have an epidural anesthetic with VBAC.  It is safe to turn the baby from a breech position (attempt an external cephalic version).  It is safe to try a VBAC with twins.  VBAC may not be successful if your baby weights 8.8 lb (4 kg) or more. However, weight predictions are not always accurate and should not be used alone to decide if VBAC is right for you.  There is an increased failure rate  if the time between the cesarean delivery and VBAC is less than 19 months.  Your health care provider may advise against a VBAC if you have preeclampsia (high blood pressure, protein in the urine, and swelling of face and extremities).  VBAC is often successful if you previously gave birth vaginally.  VBAC is often successful when the labor starts spontaneously before the due date.  Delivering a baby through a VBAC is similar to having a normal spontaneous vaginal delivery. This information is not intended to replace advice given to you by your health care provider. Make sure you discuss any questions you have with your health care provider. Document Released: 03/28/2007 Document Revised: 03/12/2016 Document Reviewed: 05/04/2013 Elsevier Interactive Patient Education  2018 Elsevier Inc.  

## 2018-05-02 NOTE — Progress Notes (Signed)
   PRENATAL VISIT NOTE  Subjective:  Joanna Banks is a 32 y.o. G3P2002 at 2980w4d being seen today for ongoing prenatal care.  She is currently monitored for the following issues for this low-risk pregnancy and has Supervision of other normal pregnancy, antepartum; Previous cesarean delivery affecting pregnancy, antepartum; and History of delivery of macrosomal infant on their problem list.  Patient reports occasional contractions.  Contractions: Irregular. Vag. Bleeding: None.  Movement: Present. Denies leaking of fluid.   The following portions of the patient's history were reviewed and updated as appropriate: allergies, current medications, past family history, past medical history, past social history, past surgical history and problem list. Problem list updated.  Objective:   Vitals:   05/02/18 0934  BP: 132/77  Pulse: 90  Weight: 249 lb (112.9 kg)    Fetal Status: Fetal Heart Rate (bpm): 145   Movement: Present     General:  Alert, oriented and cooperative. Patient is in no acute distress.  Skin: Skin is warm and dry. No rash noted.   Cardiovascular: Normal heart rate noted  Respiratory: Normal respiratory effort, no problems with respiration noted  Abdomen: Soft, gravid, appropriate for gestational age.  Pain/Pressure: Present     Pelvic: Cervical exam performed       1-2/70-80/-3/vtx   Extremities: Normal range of motion.  Edema: Trace  Mental Status: Normal mood and affect. Normal behavior. Normal judgment and thought content.   Assessment and Plan:  Pregnancy: G3P2002 at 8580w4d  1. Previous cesarean delivery affecting pregnancy, antepartum      Discussed TOLAC  2. History of delivery of macrosomal infant      Had 3rd dgree lac last time, discussed measures to help decrease  Term labor symptoms and general obstetric precautions including but not limited to vaginal bleeding, contractions, leaking of fluid and fetal movement were reviewed in detail with the  patient. Please refer to After Visit Summary for other counseling recommendations.  Return in about 1 week (around 05/09/2018) [place] [facility] [facility].  Future Appointments  Date Time Provider Department Center  05/09/2018  9:15 AM Sharyon Cableogers, Veronica C, CNM [facility]    Wynelle BourgeoisMarie Williams, CNM

## 2018-05-09 ENCOUNTER — Encounter: Payer: Self-pay | Admitting: Certified Nurse Midwife

## 2018-05-09 ENCOUNTER — Ambulatory Visit (INDEPENDENT_AMBULATORY_CARE_PROVIDER_SITE_OTHER): Payer: BC Managed Care – PPO | Admitting: Certified Nurse Midwife

## 2018-05-09 ENCOUNTER — Telehealth (HOSPITAL_COMMUNITY): Payer: Self-pay | Admitting: *Deleted

## 2018-05-09 VITALS — BP 123/71 | Wt 253.0 lb

## 2018-05-09 DIAGNOSIS — O480 Post-term pregnancy: Secondary | ICD-10-CM

## 2018-05-09 DIAGNOSIS — Z3483 Encounter for supervision of other normal pregnancy, third trimester: Secondary | ICD-10-CM

## 2018-05-09 DIAGNOSIS — Z3480 Encounter for supervision of other normal pregnancy, unspecified trimester: Secondary | ICD-10-CM

## 2018-05-09 DIAGNOSIS — O34219 Maternal care for unspecified type scar from previous cesarean delivery: Secondary | ICD-10-CM

## 2018-05-09 NOTE — Telephone Encounter (Signed)
Preadmission screen  

## 2018-05-09 NOTE — Patient Instructions (Signed)
Labor Induction Labor induction is when steps are taken to cause a pregnant woman to begin the labor process. Most women go into labor on their own between 37 weeks and 42 weeks of the pregnancy. When this does not happen or when there is a medical need, methods may be used to induce labor. Labor induction causes a pregnant woman's uterus to contract. It also causes the cervix to soften (ripen), open (dilate), and thin out (efface). Usually, labor is not induced before 39 weeks of the pregnancy unless there is a problem with the baby or mother. Before inducing labor, your health care provider will consider a number of factors, including the following:  The medical condition of you and the baby.  How many weeks along you are.  The status of the baby's lung maturity.  The condition of the cervix.  The position of the baby. What are the reasons for labor induction? Labor may be induced for the following reasons:  The health of the baby or mother is at risk.  The pregnancy is overdue by 1 week or more.  The water breaks but labor does not start on its own.  The mother has a health condition or serious illness, such as high blood pressure, infection, placental abruption, or diabetes.  The amniotic fluid amounts are low around the baby.  The baby is distressed. Convenience or wanting the baby to be born on a certain date is not a reason for inducing labor. What methods are used for labor induction? Several methods of labor induction may be used, such as:  Prostaglandin medicine. This medicine causes the cervix to dilate and ripen. The medicine will also start contractions. It can be taken by mouth or by inserting a suppository into the vagina.  Inserting a thin tube (catheter) with a balloon on the end into the vagina to dilate the cervix. Once inserted, the balloon is expanded with water, which causes the cervix to open.  Stripping the membranes. Your health care provider separates  amniotic sac tissue from the cervix, causing the cervix to be stretched and causing the release of a hormone called progesterone. This may cause the uterus to contract. It is often done during an office visit. You will be sent home to wait for the contractions to begin. You will then come in for an induction.  Breaking the water. Your health care provider makes a hole in the amniotic sac using a small instrument. Once the amniotic sac breaks, contractions should begin. This may still take hours to see an effect.  Medicine to trigger or strengthen contractions. This medicine is given through an IV access tube inserted into a vein in your arm. All of the methods of induction, besides stripping the membranes, will be done in the hospital. Induction is done in the hospital so that you and the baby can be carefully monitored. How long does it take for labor to be induced? Some inductions can take up to 2-3 days. Depending on the cervix, it usually takes less time. It takes longer when you are induced early in the pregnancy or if this is your first pregnancy. If a mother is still pregnant and the induction has been going on for 2-3 days, either the mother will be sent home or a cesarean delivery will be needed. What are the risks associated with labor induction? Some of the risks of induction include:  Changes in fetal heart rate, such as too high, too low, or erratic.  Fetal distress.    Chance of infection for the mother and baby.  Increased chance of having a cesarean delivery.  Breaking off (abruption) of the placenta from the uterus (rare).  Uterine rupture (very rare). When induction is needed for medical reasons, the benefits of induction may outweigh the risks. What are some reasons for not inducing labor? Labor induction should not be done if:  It is shown that your baby does not tolerate labor.  You have had previous surgeries on your uterus, such as a myomectomy or the removal of  fibroids.  Your placenta lies very low in the uterus and blocks the opening of the cervix (placenta previa).  Your baby is not in a head-down position.  The umbilical cord drops down into the birth canal in front of the baby. This could cut off the baby's blood and oxygen supply.  You have had a previous cesarean delivery.  There are unusual circumstances, such as the baby being extremely premature. This information is not intended to replace advice given to you by your health care provider. Make sure you discuss any questions you have with your health care provider. Document Released: 02/24/2007 Document Revised: 03/12/2016 Document Reviewed: 05/04/2013 Elsevier Interactive Patient Education  2017 Elsevier Inc.  

## 2018-05-11 ENCOUNTER — Encounter: Payer: BC Managed Care – PPO | Admitting: Obstetrics & Gynecology

## 2018-05-11 NOTE — Progress Notes (Signed)
   PRENATAL VISIT NOTE  Subjective:  Joanna Banks is a 32 y.o. G3P2002 at 6062w6d being seen today for ongoing prenatal care.  She is currently monitored for the following issues for this low-risk pregnancy and has Supervision of other normal pregnancy, antepartum; Previous cesarean delivery affecting pregnancy, antepartum; and History of delivery of macrosomal infant on their problem list.  Patient reports occasional contractions.  Contractions: Irregular. Vag. Bleeding: None.  Movement: Present. Denies leaking of fluid.   The following portions of the patient's history were reviewed and updated as appropriate: allergies, current medications, past family history, past medical history, past social history, past surgical history and problem list. Problem list updated.  Objective:   Vitals:   05/09/18 0918  BP: 123/71  Weight: 253 lb (114.8 kg)    Fetal Status: Fetal Heart Rate (bpm): 143 NST_R Fundal Height: 37 cm Movement: Present     General:  Alert, oriented and cooperative. Patient is in no acute distress.  Skin: Skin is warm and dry. No rash noted.   Cardiovascular: Normal heart rate noted  Respiratory: Normal respiratory effort, no problems with respiration noted  Abdomen: Soft, gravid, appropriate for gestational age.  Pain/Pressure: Present     Pelvic: Cervical exam deferred        Extremities: Normal range of motion.  Edema: Trace  Mental Status: Normal mood and affect. Normal behavior. Normal judgment and thought content.   Assessment and Plan:  Pregnancy: G3P2002 at 4762w6d  1. Supervision of other normal pregnancy, antepartum -Patient doing well, no complaints  -NST reactive in office today  -Discussed plan of care with patient with plan for induction at 41 weeks.  -No induction times available for 25th, will schedule patient for OP foley bulb placement with IOL at [place] on 7/26. Patient agrees to plan of care.  -Orders placed for IOL   2. Previous cesarean delivery  affecting pregnancy, antepartum -TOLAC planned   Term labor symptoms and general obstetric precautions including but not limited to vaginal bleeding, contractions, leaking of fluid and fetal movement were reviewed in detail with the patient. Please refer to After Visit Summary for other counseling recommendations.   Future Appointments  Date Time Provider Department Center  05/12/2018  1:15 PM Allie Bossierove, Myra C, MD [facility]  05/13/2018 12:00 AM [facility] None    Sharyon CableVeronica C Fronia Depass, CNM

## 2018-05-11 NOTE — Progress Notes (Incomplete)
   PRENATAL VISIT NOTE  Subjective:  Joanna Banks is a 32 y.o. G3P2002 at 3073w6d being seen today for ongoing prenatal care.  She is currently monitored for the following issues for this {Blank single:19197::"high-risk","low-risk"} pregnancy and has Supervision of other normal pregnancy, antepartum; Previous cesarean delivery affecting pregnancy, antepartum; and History of delivery of macrosomal infant on their problem list.  Patient reports {sx:14538}.  Contractions: Irregular. Vag. Bleeding: None.  Movement: Present. Denies leaking of fluid.   The following portions of the patient's history were reviewed and updated as appropriate: allergies, current medications, past family history, past medical history, past social history, past surgical history and problem list. Problem list updated.  Objective:   Vitals:   05/09/18 0918  BP: 123/71  Weight: 253 lb (114.8 kg)    Fetal Status: Fetal Heart Rate (bpm): 143 NST_R Fundal Height: 37 cm Movement: Present     General:  Alert, oriented and cooperative. Patient is in no acute distress.  Skin: Skin is warm and dry. No rash noted.   Cardiovascular: Normal heart rate noted  Respiratory: Normal respiratory effort, no problems with respiration noted  Abdomen: Soft, gravid, appropriate for gestational age.  Pain/Pressure: Present     Pelvic: {Blank single:19197::"Cervical exam performed","Cervical exam deferred"}        Extremities: Normal range of motion.  Edema: Trace  Mental Status: Normal mood and affect. Normal behavior. Normal judgment and thought content.   Assessment and Plan:  Pregnancy: G3P2002 at 5973w6d  1. Supervision of other normal pregnancy, antepartum ***  2. Previous cesarean delivery affecting pregnancy, antepartum ***  {Blank single:19197::"Term","Preterm"} labor symptoms and general obstetric precautions including but not limited to vaginal bleeding, contractions, leaking of fluid and fetal movement were reviewed in  detail with the patient. Please refer to After Visit Summary for other counseling recommendations.  No follow-ups on file.  Future Appointments  Date Time Provider Department Center  05/12/2018  1:15 PM Allie Bossierove, Myra C, MD [facility]  05/13/2018 12:00 AM [facility] RO[facility] None    Sharyon CableVeronica C Vedansh Kerstetter, CNM

## 2018-05-12 ENCOUNTER — Ambulatory Visit (INDEPENDENT_AMBULATORY_CARE_PROVIDER_SITE_OTHER): Payer: BC Managed Care – PPO | Admitting: Obstetrics & Gynecology

## 2018-05-12 VITALS — BP 133/76 | HR 98 | Wt 256.0 lb

## 2018-05-12 DIAGNOSIS — O34219 Maternal care for unspecified type scar from previous cesarean delivery: Secondary | ICD-10-CM

## 2018-05-12 DIAGNOSIS — Z3480 Encounter for supervision of other normal pregnancy, unspecified trimester: Secondary | ICD-10-CM

## 2018-05-12 NOTE — Progress Notes (Signed)
   PRENATAL VISIT NOTE  Subjective:  Joanna Banks is a 32 y.o. G3P2002 at 8923w0d being seen today for ongoing prenatal care.  She is currently monitored for the following issues for this low-risk pregnancy and has Supervision of other normal pregnancy, antepartum; Previous cesarean delivery affecting pregnancy, antepartum; and History of delivery of macrosomal infant on their problem list.  Patient reports no complaints.  Contractions: Irregular. Vag. Bleeding: None.  Movement: Present. Denies leaking of fluid.   The following portions of the patient's history were reviewed and updated as appropriate: allergies, current medications, past family history, past medical history, past social history, past surgical history and problem list. Problem list updated.  Objective:   Vitals:   05/12/18 1322  BP: 133/76  Pulse: 98  Weight: 256 lb (116.1 kg)    Fetal Status: Fetal Heart Rate (bpm): 142   Movement: Present     General:  Alert, oriented and cooperative. Patient is in no acute distress.  Skin: Skin is warm and dry. No rash noted.   Cardiovascular: Normal heart rate noted  Respiratory: Normal respiratory effort, no problems with respiration noted  Abdomen: Soft, gravid, appropriate for gestational age.  Pain/Pressure: Present     Pelvic: Cervical exam performed        Extremities: Normal range of motion.  Edema: Mild pitting, slight indentation  Mental Status: Normal mood and affect. Normal behavior. Normal judgment and thought content.   Assessment and Plan:  Pregnancy: G3P2002 at 3823w0d  1. Supervision of other normal pregnancy, antepartum  2. Previous cesarean delivery affecting pregnancy, antepartum - She is here for a Foley bulb insertion prior to her IOL tonight, but it would be futile to put it in a 4cm dilated cervix.   Term labor symptoms and general obstetric precautions including but not limited to vaginal bleeding, contractions, leaking of fluid and fetal movement were  reviewed in detail with the patient. Please refer to After Visit Summary for other counseling recommendations.  Return in about 1 month (around 06/09/2018) for postpartum visit.  Future Appointments  Date Time Provider Department Center  05/13/2018 12:00 AM [facility] None    Allie BossierMyra C Dresden Ament, MD

## 2018-05-13 ENCOUNTER — Encounter (HOSPITAL_COMMUNITY): Payer: Self-pay

## 2018-05-13 ENCOUNTER — Other Ambulatory Visit: Payer: Self-pay

## 2018-05-13 ENCOUNTER — Inpatient Hospital Stay (HOSPITAL_COMMUNITY): Payer: BC Managed Care – PPO | Admitting: Anesthesiology

## 2018-05-13 ENCOUNTER — Inpatient Hospital Stay (HOSPITAL_COMMUNITY)
Admission: RE | Admit: 2018-05-13 | Discharge: 2018-05-15 | DRG: 768 | Disposition: A | Discharge status: Home / Self Care | Payer: BC Managed Care – PPO | Attending: Family Medicine | Admitting: Family Medicine

## 2018-05-13 DIAGNOSIS — O480 Post-term pregnancy: Secondary | ICD-10-CM | POA: Diagnosis present

## 2018-05-13 DIAGNOSIS — Z3A41 41 weeks gestation of pregnancy: Secondary | ICD-10-CM

## 2018-05-13 DIAGNOSIS — D649 Anemia, unspecified: Secondary | ICD-10-CM | POA: Diagnosis present

## 2018-05-13 DIAGNOSIS — O9902 Anemia complicating childbirth: Secondary | ICD-10-CM | POA: Diagnosis present

## 2018-05-13 DIAGNOSIS — Z87891 Personal history of nicotine dependence: Secondary | ICD-10-CM | POA: Diagnosis not present

## 2018-05-13 DIAGNOSIS — O34219 Maternal care for unspecified type scar from previous cesarean delivery: Secondary | ICD-10-CM

## 2018-05-13 LAB — CBC
HCT: 36.1 % (ref 36.0–46.0)
HEMATOCRIT: 32.1 % — AB (ref 36.0–46.0)
HEMOGLOBIN: 10.8 g/dL — AB (ref 12.0–15.0)
Hemoglobin: 11.9 g/dL — ABNORMAL LOW (ref 12.0–15.0)
MCH: 27.1 pg (ref 26.0–34.0)
MCH: 27.5 pg (ref 26.0–34.0)
MCHC: 33 g/dL (ref 30.0–36.0)
MCHC: 33.6 g/dL (ref 30.0–36.0)
MCV: 82.2 fL (ref 78.0–100.0)
MCV: 81.7 fL (ref 78.0–100.0)
Platelets: 249 10*3/uL (ref 150–400)
Platelets: 236 10*3/uL (ref 150–400)
RBC: 4.39 MIL/uL (ref 3.87–5.11)
RBC: 3.93 MIL/uL (ref 3.87–5.11)
RDW: 14.8 % (ref 11.5–15.5)
RDW: 14.3 % (ref 11.5–15.5)
WBC: 16.6 10*3/uL — ABNORMAL HIGH (ref 4.0–10.5)
WBC: 27.8 10*3/uL — ABNORMAL HIGH (ref 4.0–10.5)

## 2018-05-13 LAB — TYPE AND SCREEN
ABO/RH(D): A POS
Antibody Screen: NEGATIVE

## 2018-05-13 LAB — ABO/RH: ABO/RH(D): A POS

## 2018-05-13 LAB — RPR: RPR Ser Ql: NONREACTIVE

## 2018-05-13 MED ORDER — DIPHENHYDRAMINE HCL 25 MG PO CAPS: 25.0000 mg | ORAL_CAPSULE | Freq: Four times a day (QID) | ORAL | Status: DC | PRN

## 2018-05-13 MED ORDER — WITCH HAZEL-GLYCERIN EX PADS: 1.0000 "application " | MEDICATED_PAD | CUTANEOUS | Status: DC | PRN

## 2018-05-13 MED ORDER — OXYTOCIN 40 UNITS IN LACTATED RINGERS INFUSION - SIMPLE MED: 2.5000 [IU]/h | INTRAVENOUS | Status: DC

## 2018-05-13 MED ORDER — TETANUS-DIPHTH-ACELL PERTUSSIS 5-2.5-18.5 LF-MCG/0.5 IM SUSP: 0.5000 mL | Freq: Once | INTRAMUSCULAR | Status: DC

## 2018-05-13 MED ORDER — DIBUCAINE 1 % RE OINT: 1.0000 "application " | TOPICAL_OINTMENT | RECTAL | Status: DC | PRN

## 2018-05-13 MED ORDER — LACTATED RINGERS IV SOLN
500.0000 mL | INTRAVENOUS | Status: DC | PRN
  Administered 2018-05-13: 1000 mL via INTRAVENOUS

## 2018-05-13 MED ORDER — SIMETHICONE 80 MG PO CHEW: 80.0000 mg | CHEWABLE_TABLET | ORAL | Status: DC | PRN

## 2018-05-13 MED ORDER — OXYCODONE-ACETAMINOPHEN 5-325 MG PO TABS: 2.0000 | ORAL_TABLET | ORAL | Status: DC | PRN

## 2018-05-13 MED ORDER — MISOPROSTOL 200 MCG PO TABS
ORAL_TABLET | ORAL | Status: AC
  Filled 2018-05-13: qty 5

## 2018-05-13 MED ORDER — TRANEXAMIC ACID 1000 MG/10ML IV SOLN: 1000.0000 mg | INTRAVENOUS | Status: DC

## 2018-05-13 MED ORDER — EPHEDRINE 5 MG/ML INJ
10.0000 mg | INTRAVENOUS | Status: DC | PRN
  Filled 2018-05-13: qty 2

## 2018-05-13 MED ORDER — ACETAMINOPHEN 325 MG PO TABS
650.0000 mg | ORAL_TABLET | ORAL | Status: DC | PRN
Start: 2018-05-13 — End: 2018-05-15

## 2018-05-13 MED ORDER — LACTATED RINGERS IV SOLN: 500.0000 mL | Freq: Once | INTRAVENOUS | Status: DC

## 2018-05-13 MED ORDER — OXYTOCIN BOLUS FROM INFUSION
500.0000 mL | Freq: Once | INTRAVENOUS | Status: AC
  Administered 2018-05-13: 500 mL via INTRAVENOUS

## 2018-05-13 MED ORDER — COCONUT OIL OIL: 1.0000 "application " | TOPICAL_OIL | Status: DC | PRN

## 2018-05-13 MED ORDER — POLYETHYLENE GLYCOL 3350 17 G PO PACK
17.0000 g | PACK | Freq: Every day | ORAL | Status: DC
  Administered 2018-05-14: 17 g via ORAL
  Filled 2018-05-13 (×4): qty 1

## 2018-05-13 MED ORDER — ONDANSETRON HCL 4 MG PO TABS: 4.0000 mg | ORAL_TABLET | ORAL | Status: DC | PRN

## 2018-05-13 MED ORDER — ONDANSETRON HCL 4 MG/2ML IJ SOLN: 4.0000 mg | INTRAMUSCULAR | Status: DC | PRN

## 2018-05-13 MED ORDER — LIDOCAINE HCL (PF) 1 % IJ SOLN
INTRAMUSCULAR | Status: DC | PRN
  Administered 2018-05-13 (×2): 5 mL via EPIDURAL

## 2018-05-13 MED ORDER — FERROUS SULFATE 325 (65 FE) MG PO TABS
325.0000 mg | ORAL_TABLET | Freq: Two times a day (BID) | ORAL | Status: DC
  Administered 2018-05-13 – 2018-05-15 (×4): 325 mg via ORAL
  Filled 2018-05-13 (×5): qty 1

## 2018-05-13 MED ORDER — DIPHENHYDRAMINE HCL 50 MG/ML IJ SOLN: 12.5000 mg | INTRAMUSCULAR | Status: DC | PRN

## 2018-05-13 MED ORDER — ZOLPIDEM TARTRATE 5 MG PO TABS: 5.0000 mg | ORAL_TABLET | Freq: Every evening | ORAL | Status: DC | PRN

## 2018-05-13 MED ORDER — PRENATAL MULTIVITAMIN CH
1.0000 | ORAL_TABLET | Freq: Every day | ORAL | Status: DC
  Administered 2018-05-14 – 2018-05-15 (×2): 1 via ORAL
  Filled 2018-05-13 (×2): qty 1

## 2018-05-13 MED ORDER — OXYCODONE-ACETAMINOPHEN 5-325 MG PO TABS: 1.0000 | ORAL_TABLET | ORAL | Status: DC | PRN

## 2018-05-13 MED ORDER — IBUPROFEN 600 MG PO TABS
600.0000 mg | ORAL_TABLET | Freq: Four times a day (QID) | ORAL | Status: DC
  Administered 2018-05-13 – 2018-05-15 (×8): 600 mg via ORAL
  Filled 2018-05-13 (×8): qty 1

## 2018-05-13 MED ORDER — LACTATED RINGERS IV SOLN
INTRAVENOUS | Status: DC
  Administered 2018-05-13 (×2): via INTRAVENOUS

## 2018-05-13 MED ORDER — BENZOCAINE-MENTHOL 20-0.5 % EX AERO
1.0000 "application " | INHALATION_SPRAY | CUTANEOUS | Status: DC | PRN
  Filled 2018-05-13: qty 56

## 2018-05-13 MED ORDER — ONDANSETRON HCL 4 MG/2ML IJ SOLN: 4.0000 mg | Freq: Four times a day (QID) | INTRAMUSCULAR | Status: DC | PRN

## 2018-05-13 MED ORDER — LIDOCAINE HCL (PF) 1 % IJ SOLN
30.0000 mL | INTRAMUSCULAR | Status: AC | PRN
  Administered 2018-05-13: 30 mL via SUBCUTANEOUS
  Filled 2018-05-13: qty 30

## 2018-05-13 MED ORDER — PHENYLEPHRINE 40 MCG/ML (10ML) SYRINGE FOR IV PUSH (FOR BLOOD PRESSURE SUPPORT)
80.0000 ug | PREFILLED_SYRINGE | INTRAVENOUS | Status: DC | PRN
  Filled 2018-05-13: qty 5

## 2018-05-13 MED ORDER — OXYTOCIN 40 UNITS IN LACTATED RINGERS INFUSION - SIMPLE MED
1.0000 m[IU]/min | INTRAVENOUS | Status: DC
  Administered 2018-05-13: 2 m[IU]/min via INTRAVENOUS
  Filled 2018-05-13: qty 1000

## 2018-05-13 MED ORDER — PHENYLEPHRINE 40 MCG/ML (10ML) SYRINGE FOR IV PUSH (FOR BLOOD PRESSURE SUPPORT)
80.0000 ug | PREFILLED_SYRINGE | INTRAVENOUS | Status: DC | PRN
  Filled 2018-05-13: qty 10
  Filled 2018-05-13: qty 5

## 2018-05-13 MED ORDER — SOD CITRATE-CITRIC ACID 500-334 MG/5ML PO SOLN: 30.0000 mL | ORAL | Status: DC | PRN

## 2018-05-13 MED ORDER — ACETAMINOPHEN 325 MG PO TABS: 650.0000 mg | ORAL_TABLET | ORAL | Status: DC | PRN

## 2018-05-13 MED ORDER — TERBUTALINE SULFATE 1 MG/ML IJ SOLN
0.2500 mg | Freq: Once | INTRAMUSCULAR | Status: DC | PRN
  Filled 2018-05-13: qty 1

## 2018-05-13 MED ORDER — TRANEXAMIC ACID 1000 MG/10ML IV SOLN
INTRAVENOUS | Status: AC
  Administered 2018-05-13: 13:00:00
  Filled 2018-05-13: qty 10

## 2018-05-13 MED ORDER — FENTANYL 2.5 MCG/ML BUPIVACAINE 1/10 % EPIDURAL INFUSION (WH - ANES)
14.0000 mL/h | INTRAMUSCULAR | Status: DC | PRN
  Administered 2018-05-13: 14 mL/h via EPIDURAL
  Filled 2018-05-13: qty 100

## 2018-05-13 NOTE — Progress Notes (Signed)
Patient ID: Joanna Banks, female   DOB: 02/12/1986, 32 y.o.   MRN: [id number]  Introduced self to patient. She has no complaints.  She is feeling her contractions but they are not painful.  Pt has an epidural.    She is currently on pitocin 828mu/min. MVHQ@ 4696AROM@ 0554.    FHT: prior to talking to patient, nurse informed me that pt had a prolonged decel of approx 5 minutes to ~ 110bpm that improved with repositioning to ~150.   BP (!) 103/56   Pulse 76   Temp 97.8 F (36.6 C) (Oral)   Resp 18   Ht 5\' 3"  (1.6 m)   Wt 117.1 kg (258 lb 1.6 oz)   LMP 07/29/2017 (Exact Date)   SpO2 96%   BMI 45.72 kg/m   Dilation: 9 Effacement (%): 100 Station: -1 Presentation: Vertex Exam by:: J.Thornton, rn

## 2018-05-13 NOTE — Anesthesia Procedure Notes (Signed)
Epidural Patient location during procedure: OB Start time: 05/13/2018 7:51 AM End time: 05/13/2018 8:05 AM  Staffing Anesthesiologist: Heather RobertsSinger, Sakai Heinle, MD Performed: anesthesiologist   Preanesthetic Checklist Completed: patient identified, site marked, pre-op evaluation, timeout performed, IV checked, risks and benefits discussed and monitors and equipment checked  Epidural Patient position: sitting Prep: DuraPrep Patient monitoring: heart rate, cardiac monitor, continuous pulse ox and blood pressure Approach: midline Location: L2-L3 Injection technique: LOR saline  Needle:  Needle type: Tuohy  Needle gauge: 17 G Needle length: 9 cm Needle insertion depth: 8 cm Catheter size: 20 Guage Catheter at skin depth: 14 cm Test dose: negative and Other  Assessment Events: blood not aspirated, injection not painful, no injection resistance and negative IV test  Additional Notes Informed consent obtained prior to proceeding including risk of failure, 1% risk of PDPH, risk of minor discomfort and bruising.  Discussed rare but serious complications including epidural abscess, permanent nerve injury, epidural hematoma.  Discussed alternatives to epidural analgesia and patient desires to proceed.  Timeout performed pre-procedure verifying patient name, procedure, and platelet count.  Patient tolerated procedure well.

## 2018-05-13 NOTE — Progress Notes (Signed)
Vitals:   05/13/18 0605 05/13/18 0610  BP:    Pulse:    Resp:    Temp:    SpO2: 99% 98%   cx 7/90/-2.  AROM w/clear fluid. Using nitrous, doesn't want epidural.  Ctx q 2-3 minutes, strong per pt.  FHR Cat 1. Pitocin at 8 mu/min.  Continue present mgt.

## 2018-05-13 NOTE — Anesthesia Preprocedure Evaluation (Signed)
Anesthesia Evaluation  Patient identified by MRN, date of birth, ID band Patient awake    Reviewed: Allergy & Precautions, NPO status , Patient's Chart, lab work & pertinent test results  Airway Mallampati: III  TM Distance: >3 FB Neck ROM: Full    Dental no notable dental hx. (+) Dental Advisory Given   Pulmonary former smoker,    Pulmonary exam normal        Cardiovascular negative cardio ROS Normal cardiovascular exam     Neuro/Psych negative neurological ROS  negative psych ROS   GI/Hepatic negative GI ROS, Neg liver ROS,   Endo/Other  negative endocrine ROS  Renal/GU negative Renal ROS  negative genitourinary   Musculoskeletal negative musculoskeletal ROS (+)   Abdominal   Peds negative pediatric ROS (+)  Hematology negative hematology ROS (+)   Anesthesia Other Findings   Reproductive/Obstetrics                             Anesthesia Physical Anesthesia Plan  ASA: III  Anesthesia Plan: Epidural   Post-op Pain Management:    Induction:   PONV Risk Score and Plan:   Airway Management Planned: Natural Airway  Additional Equipment:   Intra-op Plan:   Post-operative Plan:   Informed Consent: I have reviewed the patients History and Physical, chart, labs and discussed the procedure including the risks, benefits and alternatives for the proposed anesthesia with the patient or authorized representative who has indicated his/her understanding and acceptance.   Dental advisory given  Plan Discussed with: Anesthesiologist  Anesthesia Plan Comments:         Anesthesia Quick Evaluation

## 2018-05-13 NOTE — Anesthesia Pain Management Evaluation Note (Signed)
  CRNA Pain Management Visit Note  Patient: Joanna Banks, 32 y.o., female  "Hello I am a member of the anesthesia team at [facility]. We have an anesthesia team available at all times to provide care throughout the hospital, including epidural management and anesthesia for C-section. I don't know your plan for the delivery whether it a natural birth, water birth, IV sedation, nitrous supplementation, doula or epidural, but we want to meet your pain goals."   1.Was your pain managed to your expectations on prior hospitalizations?   Yes   2.What is your expectation for pain management during this hospitalization?     Epidural  3.How can we help you reach that goal?   Record the patient's initial score and the patient's pain goal.   Pain: 0  Pain Goal: 5 [facility] wants you to be able to say your pain was always managed very well.  Joanna Banks,Joanna Banks 05/13/2018

## 2018-05-13 NOTE — H&P (Signed)
Joanna Banks is a 32 y.o. female G43P2002 with IUP at [redacted]w[redacted]d presenting for IOL for postdates. [facility]  Prenatal History/Complications:  Hx PCS for macrosomia; VBAC 10#10oz, ? SD (mild)   Past Medical History: Past Medical History:  Diagnosis Date  . PCOS (polycystic ovarian syndrome)     Past Surgical History: Past Surgical History:  Procedure Laterality Date  . CESAREAN SECTION      Obstetrical History: OB History    Gravida  3   Para  2   Term  2   Preterm      AB      Living  2     SAB      TAB      Ectopic      Multiple      Live Births              Social History: Social History   Socioeconomic History  . Marital status: Married    Spouse name: Not on file  . Number of children: Not on file  . Years of education: Not on file  . Highest education level: Not on file  Occupational History  . Not on file  Social Needs  . Financial resource strain: Not on file  . Food insecurity:    Worry: Not on file    Inability: Not on file  . Transportation needs:    Medical: Not on file    Non-medical: Not on file  Tobacco Use  . Smoking status: Former Smoker    Types: Cigarettes  . Smokeless tobacco: Never Used  Substance and Sexual Activity  . Alcohol use: No    Frequency: Never  . Drug use: No  . Sexual activity: Yes    Birth control/protection: None  Lifestyle  . Physical activity:    Days per week: Not on file    Minutes per session: Not on file  . Stress: Not on file  Relationships  . Social connections:    Talks on phone: Not on file    Gets together: Not on file    Attends religious service: Not on file    Active member of club or organization: Not on file    Attends meetings of clubs or organizations: Not on file    Relationship status: Not on file  Other Topics Concern  . Not on file  Social History Narrative  . Not on file    Family History: Family History  Problem Relation Age of Onset  . Breast cancer  Maternal Aunt   . Lung cancer Maternal Grandfather   . Lung cancer Paternal Grandfather     Allergies: Allergies  Allergen Reactions  . Sulfa Antibiotics Hives    Medications Prior to Admission  Medication Sig Dispense Refill Last Dose  . Prenatal Vit-Fe Fumarate-FA (MULTIVITAMIN-PRENATAL) 27-0.8 MG TABS tablet Take 1 tablet by mouth daily at 12 noon.   05/12/2018 at Unknown time  . metFORMIN (GLUCOPHAGE) 500 MG tablet Take 500 mg by mouth 3 (three) times daily.  11 More than a month at Unknown time        Review of Systems   Constitutional: Negative for fever and chills Eyes: Negative for visual disturbances Respiratory: Negative for shortness of breath, dyspnea Cardiovascular: Negative for chest pain or palpitations  Gastrointestinal: Negative for abdominal pain, vomiting, diarrhea and constipation.   Genitourinary: Negative for dysuria and urgency Musculoskeletal: Negative for back pain, joint pain, myalgias  Neurological: Negative for dizziness and headaches  Blood pressure (!) 125/53, pulse 91, temperature 98.7 F (37.1 C), temperature source Oral, resp. rate 20, height 5\' 3"  (1.6 m), weight 117.1 kg (258 lb 1.6 oz), last menstrual period 07/29/2017, SpO2 100 %. General appearance: alert, cooperative and appears stated age Lungs: clear to auscultation bilaterally Heart: regular rate and rhythm Abdomen: soft, non-tender; bowel sounds normal Extremities: Homans sign is negative, no sign of DVT DTR's 2+ Presentation: cephalic Fetal monitoring  Baseline: 140 bpm, Variability: Good {> 6 bpm), Accelerations: Reactive and Decelerations: Absent Uterine activity  mild  Dilation: 7 Effacement (%): 80 Station: -2 Exam by:: Chip BoerVicki, RN   Prenatal labs: ABO, Rh: --/--/A POS, A POS Performed at [facility], [address] [place], [place] [address]  [phone](07/26 0136) Antibody: NEG (07/26 0136) Rubella: immune RPR: NON-REACTIVE (04/22 0841)  HBsAg: NON-REACTIVE  (12/06 1611)  HIV: NON-REACTIVE (04/22 0841)  GBS: Negative (06/20 0000)    Prenatal Transfer Tool  Maternal Diabetes: No Genetic Screening: Normal Maternal Ultrasounds/Referrals: Normal Fetal Ultrasounds or other Referrals:  None Maternal Substance Abuse:  No Significant Maternal Medications:  None Significant Maternal Lab Results: None    Results for orders placed or performed during the hospital encounter of 05/13/18 (from the past 24 hour(s))  CBC   Collection Time: 05/13/18  1:36 AM  Result Value Ref Range   WBC 16.6 (H) 4.0 - 10.5 K/uL   RBC 4.39 3.87 - 5.11 MIL/uL   Hemoglobin 11.9 (L) 12.0 - 15.0 g/dL   HCT 19.136.1 47.836.0 - 29.546.0 %   MCV 82.2 78.0 - 100.0 fL   MCH 27.1 26.0 - 34.0 pg   MCHC 33.0 30.0 - 36.0 g/dL   RDW 62.114.8 30.811.5 - 65.715.5 %   Platelets 249 150 - 400 K/uL  Type and screen   Collection Time: 05/13/18  1:36 AM  Result Value Ref Range   ABO/RH(D) A POS    Antibody Screen NEG    Sample Expiration      05/16/2018 Performed at [facility], [address] [place], [place] [address]   ABO/Rh   Collection Time: 05/13/18  1:36 AM  Result Value Ref Range   ABO/RH(D)      A POS Performed at [facility], [address] [place], [place] [address]    Clinic  KV Prenatal Labs  Dating  LMP c/w 7186w6d sono Blood type: A/RH(D) POSITIVE/-- (12/06 1611) A pos  Genetic Screen 1 Screen: nl   AFP: nml Antibody:NO ANTIBODIES DETECTED (12/06 1611)Neg  Anatomic US Normal anatomy; boy Rubella: <0.90 (12/06 1611)NI  GTT Early:  HgA1c 4.9             Third trimester: F-79 1-124 2-114 RPR: NON-REACTIVE (12/06 1611) NR  Flu vaccine 9/18  [place] HBsAg: NON-REACTIVE (12/06 1611) Neg  TDaP vaccine      02/07/18                                          HIV: NON-REACTIVE (12/06 1611)NR  Baby Food Breast                                            GBS: negative  Contraception vasectomy Pap: negative (12/2016)- awaiting results  Circumcision Yes, in hospital    Pediatrician Dr. Romualdo Bolkial ([place])  CF:    Assessment: Joanna Banks is a 32 y.o. [id number] with an IUP at [redacted]w[redacted]d presenting for IOL for  postdates. Cx 4cms in office Plan: #Labor: pitocin #Pain:  Per request #FWB Cat 1    Jacklyn Shell 05/13/2018, 8:02 AM

## 2018-05-14 LAB — CBC WITH DIFFERENTIAL/PLATELET
BASOS ABS: 0 10*3/uL (ref 0.0–0.1)
Basophils Relative: 0 %
Eosinophils Absolute: 0.1 10*3/uL (ref 0.0–0.7)
Eosinophils Relative: 1 %
HCT: 26.7 % — ABNORMAL LOW (ref 36.0–46.0)
Hemoglobin: 9.2 g/dL — ABNORMAL LOW (ref 12.0–15.0)
Lymphocytes Relative: 15 %
Lymphs Abs: 3.2 10*3/uL (ref 0.7–4.0)
MCH: 28 pg (ref 26.0–34.0)
MCHC: 34.5 g/dL (ref 30.0–36.0)
MCV: 81.4 fL (ref 78.0–100.0)
Monocytes Absolute: 0.8 10*3/uL (ref 0.1–1.0)
Monocytes Relative: 4 %
Neutro Abs: 17.5 10*3/uL — ABNORMAL HIGH (ref 1.7–7.7)
Neutrophils Relative %: 80 %
Platelets: 211 10*3/uL (ref 150–400)
RBC: 3.28 MIL/uL — ABNORMAL LOW (ref 3.87–5.11)
RDW: 14.5 % (ref 11.5–15.5)
WBC: 21.6 10*3/uL — ABNORMAL HIGH (ref 4.0–10.5)

## 2018-05-14 MED ORDER — MEASLES, MUMPS & RUBELLA VAC ~~LOC~~ INJ
0.5000 mL | INJECTION | Freq: Once | SUBCUTANEOUS | Status: AC
  Administered 2018-05-15: 0.5 mL via SUBCUTANEOUS
  Filled 2018-05-14: qty 0.5

## 2018-05-14 NOTE — Anesthesia Postprocedure Evaluation (Signed)
Anesthesia Post Note  Patient: Joanna Banks  Procedure(s) Performed: AN AD HOC LABOR EPIDURAL     Patient location during evaluation: Mother Baby Anesthesia Type: Epidural Level of consciousness: awake and alert, oriented and patient cooperative Pain management: pain level controlled Vital Signs Assessment: post-procedure vital signs reviewed and stable Respiratory status: spontaneous breathing Cardiovascular status: stable Postop Assessment: no headache, epidural receding, patient able to bend at knees and no signs of nausea or vomiting Anesthetic complications: no Comments: Pain score 1.    Last Vitals:  Vitals:   05/14/18 0023 05/14/18 0537  BP: 113/71 121/69  Pulse: 94 87  Resp: 18 18  Temp: 36.8 C (!) 36.4 C  SpO2: 97% 99%    Last Pain:  Vitals:   05/14/18 0810  TempSrc:   PainSc: 0-No pain   Pain Goal:                 [facility],[name]

## 2018-05-14 NOTE — Lactation Note (Signed)
This note was copied from a baby's chart. Lactation Consultation Note  Patient Name: Boy Katina DegreeKatie Cantave ZOXWR'UToday's Date: 05/14/2018 Reason for consult: Initial assessment   P3, Ex BF 15 mos & 18 mos. Mother hand expressed before latching. Baby latches for a few minutes and comes off.  Noted upper lip blanches white when flanged. Mother latched baby in cradle hold.  Demonstrated how to latch in cross cradle for more depth and to support breast with either "C" or "U" hold. Mom encouraged to feed baby 8-12 times/24 hours and with feeding cues.  Mom made aware of O/P services, breastfeeding support groups, community resources, and our phone # for post-discharge questions.      Maternal Data Has patient been taught Hand Expression?: Yes Does the patient have breastfeeding experience prior to this delivery?: Yes  Feeding Feeding Type: Breast Fed Length of feed: 15 min  LATCH Score Latch: Grasps breast easily, tongue down, lips flanged, rhythmical sucking.  Audible Swallowing: A few with stimulation  Type of Nipple: Everted at rest and after stimulation  Comfort (Breast/Nipple): Soft / non-tender  Hold (Positioning): No assistance needed to correctly position infant at breast.  LATCH Score: 9  Interventions Interventions: Hand express;Breast compression  Lactation Tools Discussed/Used     Consult Status Consult Status: Follow-up Date: 05/15/18 Follow-up type: In-patient    Dahlia ByesBerkelhammer, [facility] 05/14/2018, 11:48 AM

## 2018-05-14 NOTE — Progress Notes (Signed)
Patient ID: Joanna Banks, female   DOB: 08/23/1986, 32 y.o.   MRN: [id number] POSTPARTUM PROGRESS NOTE  Post partum Day 1 Subjective:  Joanna Banks is a 32 y.o. [id number] 4071w1d s/p VBAC.  No acute events overnight.  Pt denies problems with ambulating, voiding or po intake.  She denies nausea or vomiting.  Pain is well controlled.  She has had flatus. She has had bowel movement.  Lochia Small.   Objective: Blood pressure 121/69, pulse 87, temperature (!) 97.5 F (36.4 C), temperature source Oral, resp. rate 18, height 5\' 3"  (1.6 m), weight 117.1 kg (258 lb 1.6 oz), last menstrual period 07/29/2017, SpO2 99 %, unknown if currently breastfeeding.  Physical Exam:  General: alert, cooperative and no distress Chest: no respiratory distress Abdomen: soft, appropriately tender,  Uterine Fundus: firm, above fundus, appropriately tender DVT Evaluation: No calf swelling or tenderness Extremities: no edema  Recent Labs    05/13/18 1358 05/14/18 0528  HGB 10.8* 9.2*  HCT 32.1* 26.7*    Assessment/Plan:  ASSESSMENT: Joanna Banks is a 32 y.o. G3P3003 6971w1d s/p SVD/VBAC. pateint had EBBL of 1651 and a 3A perineal laceration. Latest Hgb 9.2.   Plan for discharge tomorrow, Breastfeeding and Circumcision prior to discharge   LOS: 1 day   Jackelyn KnifeDaniel K OlsonMD 05/14/2018, 10:44 AM

## 2018-05-15 MED ORDER — FERROUS SULFATE 325 (65 FE) MG PO TABS: 325.0000 mg | ORAL_TABLET | Freq: Two times a day (BID) | ORAL | 3 refills | Status: DC

## 2018-05-15 MED ORDER — IBUPROFEN 600 MG PO TABS: 600.0000 mg | ORAL_TABLET | Freq: Four times a day (QID) | ORAL | 0 refills | Status: DC

## 2018-05-15 MED ORDER — POLYETHYLENE GLYCOL 3350 17 G PO PACK: 17.0000 g | PACK | Freq: Every day | ORAL | 0 refills | Status: DC

## 2018-05-15 NOTE — Discharge Summary (Signed)
OB Discharge Summary  Patient Name: Joanna BarrackKatie E Banks DOB: 10/24/1985 MRN: [id number]  Date of admission: 05/13/2018 Delivering MD: Frederik PearEGELE, JULIE P   Date of discharge: 05/15/2018  Admitting diagnosis: INDUCTION Intrauterine pregnancy: 4488w1d     Secondary diagnosis:Principal Problem:   VBAC (vaginal birth after Cesarean) Active Problems:   Previous cesarean delivery affecting pregnancy, antepartum   Post-dates pregnancy   Vaginal tear resulting from childbirth (3rd degree - 3A)  Additional problems:anemia     Discharge diagnosis: Term Pregnancy Delivered and Anemia                                                                     Post partum procedures:n/a  Augmentation: Pitocin  Complications: None  Hospital course:  Induction of Labor With Vaginal Delivery   32 y.o. yo G3P3003 at 3588w1d was admitted to the hospital 05/13/2018 for induction of labor.  Indication for induction: Postdates.  Patient had an uncomplicated labor course as follows: Membrane Rupture Time/Date: 5:54 AM ,05/13/2018   Intrapartum Procedures: Episiotomy: None [1]                                         Lacerations:  3rd degree [4]  Patient had delivery of a Viable infant.  Information for the patient's newborn:  Joanna Banks, Boy Joanna Banks [[id number]][[id number]]  Delivery Method: VBAC, Spontaneous(Filed from Delivery Summary)   05/13/2018  Details of delivery can be found in separate delivery note.  Patient had a routine postpartum course. Patient is discharged home 05/15/18.  Physical exam  Vitals:   05/14/18 0537 05/14/18 1445 05/14/18 2305 05/15/18 0523  BP: 121/69 120/70 107/60 116/72  Pulse: 87 93 87 86  Resp: 18 18  16   Temp: (!) 97.5 F (36.4 C) 98.2 F (36.8 C) 97.9 F (36.6 C) 98 F (36.7 C)  TempSrc: Oral Oral Oral Oral  SpO2: 99% 98%    Weight:      Height:       General: alert, cooperative and no distress Lochia: appropriate Uterine Fundus: firm Incision: N/A DVT Evaluation: No evidence of DVT  seen on physical exam. Labs: Lab Results  Component Value Date   WBC 21.6 (H) 05/14/2018   HGB 9.2 (L) 05/14/2018   HCT 26.7 (L) 05/14/2018   MCV 81.4 05/14/2018   PLT 211 05/14/2018   No flowsheet data found.  Discharge instruction: per After Visit Summary and "Baby and Me Booklet".  After Visit Meds:  Allergies as of 05/15/2018      Reactions   Sulfa Antibiotics Hives      Medication List    TAKE these medications   acetaminophen 325 MG tablet Commonly known as:  TYLENOL Take 650 mg by mouth every 6 (six) hours as needed for headache.   ferrous sulfate 325 (65 FE) MG tablet Take 1 tablet (325 mg total) by mouth 2 (two) times daily with a meal.   ibuprofen 600 MG tablet Commonly known as:  ADVIL,MOTRIN Take 1 tablet (600 mg total) by mouth every 6 (six) hours.   multivitamin-prenatal 27-0.8 MG Tabs tablet Take 1 tablet by mouth daily at 12 noon.  polyethylene glycol packet Commonly known as:  MIRALAX / GLYCOLAX Take 17 g by mouth daily.       Diet: routine diet  Activity: Advance as tolerated. Pelvic rest for 6 weeks.   Outpatient follow up:6 weeks Follow up Appt:No future appointments. Follow up visit: No follow-ups on file.  Postpartum contraception: Natural Family Planning  Newborn Data: Live born female  Birth Weight: 9 lb 14 oz (4479 g) APGAR: 7, 9  Newborn Delivery   Birth date/time:  05/13/2018 12:49:00 Delivery type:  VBAC, Spontaneous     Baby Feeding: Breast Disposition:home with mother   05/15/2018 Joanna Banks, CNM

## 2018-05-17 ENCOUNTER — Telehealth: Payer: Self-pay | Admitting: *Deleted

## 2018-05-17 NOTE — Telephone Encounter (Signed)
Left a message for patient to call and schedule 4 week Postpartum appt.

## 2018-06-10 ENCOUNTER — Encounter: Payer: Self-pay | Admitting: Certified Nurse Midwife

## 2018-06-10 ENCOUNTER — Ambulatory Visit (INDEPENDENT_AMBULATORY_CARE_PROVIDER_SITE_OTHER): Payer: BC Managed Care – PPO | Admitting: Certified Nurse Midwife

## 2018-06-10 VITALS — BP 112/66 | HR 67 | Resp 16 | Ht 64.0 in | Wt 235.0 lb

## 2018-06-10 DIAGNOSIS — Z1389 Encounter for screening for other disorder: Secondary | ICD-10-CM | POA: Diagnosis not present

## 2018-06-10 DIAGNOSIS — Z8759 Personal history of other complications of pregnancy, childbirth and the puerperium: Secondary | ICD-10-CM

## 2018-06-10 NOTE — Progress Notes (Signed)
  Post Partum Exam  Joanna Banks is a 32 y.o. 593P3003 female who presents for a postpartum visit. She is 4 weeks postpartum following a spontaneous vaginal delivery. I have fully reviewed the prenatal and intrapartum course. The delivery was at 41 gestational weeks.  Anesthesia: epidural. Postpartum course has been unremarkable. Baby's course has been unremarkable. Baby is feeding by breast. Bleeding brown. Bowel function is normal. Bladder function is normal. Patient is not sexually active. Contraception method is condoms until husband get vasectomy in 2020 Postpartum depression screening:neg  The following portions of the patient's history were reviewed and updated as appropriate: allergies, current medications, past family history, past medical history, past social history, past surgical history and problem list. Last pap smear done 2018 and was Normal  (not on file) Review of Systems Pertinent items noted in HPI and remainder of comprehensive ROS otherwise negative.    Objective:  Blood pressure 112/66, pulse 67, resp. rate 16, height 5\' 4"  (1.626 m), weight 235 lb (106.6 kg), currently breastfeeding.  General:  alert, cooperative and no distress   Breasts:  not examined  Lungs: nml rate and effort  Heart:  nml rate  Abdomen: Not evaluated   Vulva:  normal  Vagina: normal vagina- well healed, suture remains present  Cervix:  nml  Corpus: not examined  Adnexa:  not evaluated  Rectal Exam: Not performed.        Assessment:   Normal postpartum exam. Third degree laceration healing well. Pap smear not done at today's visit.   Plan:   1. Contraception: condoms and plan for Vasectomy in 2020 2. Pelvic rest for another 2-3 weeks 3. Follow up in: 1 year  or as needed.

## 2018-06-27 ENCOUNTER — Telehealth: Payer: Self-pay | Admitting: *Deleted

## 2018-06-27 MED ORDER — DICLOXACILLIN SODIUM 250 MG PO CAPS: ORAL_CAPSULE | ORAL | 0 refills | Status: DC

## 2018-06-27 NOTE — Telephone Encounter (Signed)
Pt called stating that she has mastitis.  She woke up with a temp of 101 and redness around breast and very painful.  She was told to use heat to breast and will send in a RX for antibiotic and told to take tylenol for the achiness.  Encouraged to continue to pump from that breast.

## 2018-07-12 DIAGNOSIS — E669 Obesity, unspecified: Secondary | ICD-10-CM | POA: Insufficient documentation

## 2019-02-09 ENCOUNTER — Other Ambulatory Visit: Payer: Self-pay

## 2019-02-09 ENCOUNTER — Encounter: Payer: Self-pay | Admitting: Obstetrics & Gynecology

## 2019-02-09 ENCOUNTER — Ambulatory Visit (INDEPENDENT_AMBULATORY_CARE_PROVIDER_SITE_OTHER): Payer: BC Managed Care – PPO | Admitting: Obstetrics & Gynecology

## 2019-02-09 DIAGNOSIS — N938 Other specified abnormal uterine and vaginal bleeding: Secondary | ICD-10-CM | POA: Diagnosis not present

## 2019-02-09 DIAGNOSIS — F439 Reaction to severe stress, unspecified: Secondary | ICD-10-CM | POA: Diagnosis not present

## 2019-02-09 DIAGNOSIS — Z0000 Encounter for general adult medical examination without abnormal findings: Secondary | ICD-10-CM

## 2019-02-09 MED ORDER — ESCITALOPRAM OXALATE 10 MG PO TABS: 10.0000 mg | ORAL_TABLET | Freq: Every day | ORAL | 12 refills | Status: DC

## 2019-02-09 MED ORDER — METFORMIN HCL 500 MG PO TABS: 500.0000 mg | ORAL_TABLET | Freq: Two times a day (BID) | ORAL | 6 refills | Status: DC

## 2019-02-09 NOTE — Progress Notes (Signed)
   TELEHEALTH VIRTUAL GYNECOLOGY VISIT ENCOUNTER NOTE  I connected with Joanna Banks on 02/09/19 at  2:15 PM EDT by telephone at home and verified that I am speaking with the correct person using two identifiers.   I discussed the limitations, risks, security and privacy concerns of performing an evaluation and management service by telephone and the availability of in person appointments. I also discussed with the patient that there may be a patient responsible charge related to this service. The patient expressed understanding and agreed to proceed.   History:  Joanna Banks is a 33 y.o. G67P3003 female being evaluated today for new onset of a 20 day " period" . Her periods returned when her baby was about 28 months old (4 months ago) and were regular until last month. She had this same thing happen several years ago and was treated with metformin 500 TID and that helped. She has a h/o PCOS.     Past Medical History:  Diagnosis Date  . PCOS (polycystic ovarian syndrome)    Past Surgical History:  Procedure Laterality Date  . CESAREAN SECTION     The following portions of the patient's history were reviewed and updated as appropriate: allergies, current medications, past family history, past medical history, past social history, past surgical history and problem list.   She uses condoms for contraception.   Review of Systems:  Pertinent items noted in HPI and remainder of comprehensive ROS otherwise negative.  Physical Exam:   General:  Alert, oriented and cooperative.   Mental Status: Normal mood and affect perceived. Normal judgment and thought content.  Physical exam deferred due to nature of the encounter  Labs and Imaging No results found for this or any previous visit (from the past 336 hour(s)). No results found.    Assessment and Plan:     Stress- [facility], trial of lexapro 10 mg qhs, She denies HI/SI. DUB- check TSh, agreed to trial of metformin 500 mg BID   WebEx visit in 3 weeks  Rec annual in 3-4 months      I discussed the assessment and treatment plan with the patient. The patient was provided an opportunity to ask questions and all were answered. The patient agreed with the plan and demonstrated an understanding of the instructions.   The patient was advised to call back or seek an in-person evaluation/go to the ED if the symptoms worsen or if the condition fails to improve as anticipated.  I provided 20 minutes of non-face-to-face time during this encounter.   Allie Bossier, MD [facility] [employer], [facility]

## 2019-02-09 NOTE — Progress Notes (Signed)
Last period lasted 20 days

## 2019-02-27 ENCOUNTER — Ambulatory Visit (HOSPITAL_COMMUNITY): Payer: BC Managed Care – PPO | Admitting: Psychiatry

## 2019-02-27 ENCOUNTER — Other Ambulatory Visit (INDEPENDENT_AMBULATORY_CARE_PROVIDER_SITE_OTHER): Payer: BC Managed Care – PPO

## 2019-02-27 ENCOUNTER — Other Ambulatory Visit: Payer: Self-pay

## 2019-02-27 DIAGNOSIS — N938 Other specified abnormal uterine and vaginal bleeding: Secondary | ICD-10-CM

## 2019-02-27 DIAGNOSIS — Z0000 Encounter for general adult medical examination without abnormal findings: Secondary | ICD-10-CM

## 2019-02-27 NOTE — Progress Notes (Signed)
Pt here for lab work only

## 2019-02-28 LAB — COMPREHENSIVE METABOLIC PANEL
AG Ratio: 1.6 (calc) (ref 1.0–2.5)
ALT: 16 U/L (ref 6–29)
AST: 15 U/L (ref 10–30)
Albumin: 4.6 g/dL (ref 3.6–5.1)
Alkaline phosphatase (APISO): 75 U/L (ref 31–125)
BUN: 13 mg/dL (ref 7–25)
CO2: 25 mmol/L (ref 20–32)
Calcium: 10 mg/dL (ref 8.6–10.2)
Chloride: 104 mmol/L (ref 98–110)
Creat: 0.62 mg/dL (ref 0.50–1.10)
Globulin: 2.8 g/dL (calc) (ref 1.9–3.7)
Glucose, Bld: 85 mg/dL (ref 65–139)
Potassium: 4.6 mmol/L (ref 3.5–5.3)
Sodium: 140 mmol/L (ref 135–146)
Total Bilirubin: 0.3 mg/dL (ref 0.2–1.2)
Total Protein: 7.4 g/dL (ref 6.1–8.1)

## 2019-02-28 LAB — CBC
HCT: 41.8 % (ref 35.0–45.0)
Hemoglobin: 13.7 g/dL (ref 11.7–15.5)
MCH: 27.8 pg (ref 27.0–33.0)
MCHC: 32.8 g/dL (ref 32.0–36.0)
MCV: 84.8 fL (ref 80.0–100.0)
MPV: 11.7 fL (ref 7.5–12.5)
Platelets: 445 10*3/uL — ABNORMAL HIGH (ref 140–400)
RBC: 4.93 10*6/uL (ref 3.80–5.10)
RDW: 13.1 % (ref 11.0–15.0)
WBC: 9.7 10*3/uL (ref 3.8–10.8)

## 2019-02-28 LAB — LIPID PANEL
Cholesterol: 199 mg/dL (ref <200)
HDL: 64 mg/dL (ref >=50)
LDL Cholesterol (Calc): 116 mg/dL (calc) — ABNORMAL HIGH
Non-HDL Cholesterol (Calc): 135 mg/dL (calc) — ABNORMAL HIGH (ref <130)
Total CHOL/HDL Ratio: 3.1 (calc) (ref <5.0)
Triglycerides: 91 mg/dL (ref <150)

## 2019-02-28 LAB — HEMOGLOBIN A1C
Hgb A1c MFr Bld: 5.3 % of total Hgb (ref <5.7)
Mean Plasma Glucose: 105 (calc)
eAG (mmol/L): 5.8 (calc)

## 2019-02-28 LAB — VITAMIN D 25 HYDROXY (VIT D DEFICIENCY, FRACTURES): Vit D, 25-Hydroxy: 33 ng/mL (ref 30–100)

## 2019-02-28 LAB — TSH: TSH: 1.87 mIU/L

## 2019-03-02 ENCOUNTER — Ambulatory Visit (INDEPENDENT_AMBULATORY_CARE_PROVIDER_SITE_OTHER): Payer: BC Managed Care – PPO | Admitting: Obstetrics & Gynecology

## 2019-03-02 ENCOUNTER — Other Ambulatory Visit: Payer: Self-pay

## 2019-03-02 ENCOUNTER — Encounter: Payer: Self-pay | Admitting: Obstetrics & Gynecology

## 2019-03-02 DIAGNOSIS — F439 Reaction to severe stress, unspecified: Secondary | ICD-10-CM

## 2019-03-02 MED ORDER — ESCITALOPRAM OXALATE 10 MG PO TABS: 20.0000 mg | ORAL_TABLET | Freq: Every day | ORAL | 12 refills | Status: DC

## 2019-03-02 NOTE — Progress Notes (Signed)
[facility]  I connected with Joanna Banks on 03/02/19 at  2:45 PM EDT by WebEx at home and verified that I am speaking with the correct person using two identifiers.   I discussed the limitations, risks, security and privacy concerns of performing an evaluation and management service by telephone and the availability of in person appointments. I also discussed with the patient that there may be a patient responsible charge related to this service. The patient expressed understanding and agreed to proceed.   History:  Joanna Banks is a 33 y.o. [id number] female being evaluated today for her DUB. I treated her about 3 weeks ago with metformin 500 mg BID as she requested this. I also prescribed lexapro 10 mg qhs. She reported that her anxiety has not improved. Her appt with Asher MuirJamie will be rescheduled.  She denies any abnormal vaginal discharge, bleeding, pelvic pain or other  concerns.   She had a period that lasted 5 days from May 4- May 9. She uses condoms until her husband gets a vasectomy.      Past Medical History:  Diagnosis Date  . PCOS (polycystic ovarian syndrome)    Past Surgical History:  Procedure Laterality Date  . CESAREAN SECTION     The following portions of the patient's history were reviewed and updated as appropriate: allergies, current medications, past family history, past medical history, past social history, past surgical history and problem list.     Review of Systems:  Pertinent items noted in HPI and remainder of comprehensive ROS otherwise negative.  Physical Exam:   General:  Alert, oriented and cooperative. Patient appears to be in no acute distress.  Mental Status: Normal mood and affect. Normal behavior. Normal judgment and thought content.   Respiratory: Normal respiratory effort, no problems with respiration noted  Rest of physical exam deferred due to type of encounter  Labs and Imaging Results for orders  placed or performed in visit on 02/27/19 (from the past 336 hour(s))  Hemoglobin A1c   Collection Time: 02/27/19  1:45 PM  Result Value Ref Range   Hgb A1c MFr Bld 5.3 <5.7 % of total Hgb   Mean Plasma Glucose 105 (calc)   eAG (mmol/L) 5.8 (calc)  Vitamin D (25 hydroxy)   Collection Time: 02/27/19  1:45 PM  Result Value Ref Range   Vit D, 25-Hydroxy 33 30 - 100 ng/mL  Lipid panel   Collection Time: 02/27/19  1:45 PM  Result Value Ref Range   Cholesterol 199 <200 mg/dL   HDL 64 > OR = 50 mg/dL   Triglycerides 91 <161<150 mg/dL   LDL Cholesterol (Calc) 116 (H) mg/dL (calc)   Total CHOL/HDL Ratio 3.1 <5.0 (calc)   Non-HDL Cholesterol (Calc) 135 (H) <130 mg/dL (calc)  Comprehensive metabolic panel   Collection Time: 02/27/19  1:45 PM  Result Value Ref Range   Glucose, Bld 85 65 - 139 mg/dL   BUN 13 7 - 25 mg/dL   Creat 0.960.62 0.450.50 - 4.091.10 mg/dL   BUN/Creatinine Ratio NOT APPLICABLE 6 - 22 (calc)   Sodium 140 135 - 146 mmol/L   Potassium 4.6 3.5 - 5.3 mmol/L   Chloride 104 98 - 110 mmol/L   CO2 25 20 - 32 mmol/L   Calcium 10.0 8.6 - 10.2 mg/dL   Total Protein 7.4 6.1 - 8.1 g/dL   Albumin 4.6 3.6 - 5.1 g/dL   Globulin 2.8 1.9 - 3.7 g/dL (calc)  AG Ratio 1.6 1.0 - 2.5 (calc)   Total Bilirubin 0.3 0.2 - 1.2 mg/dL   Alkaline phosphatase (APISO) 75 31 - 125 U/L   AST 15 10 - 30 U/L   ALT 16 6 - 29 U/L  CBC   Collection Time: 02/27/19  1:45 PM  Result Value Ref Range   WBC 9.7 3.8 - 10.8 Thousand/uL   RBC 4.93 3.80 - 5.10 Million/uL   Hemoglobin 13.7 11.7 - 15.5 g/dL   HCT 07.8 67.5 - 44.9 %   MCV 84.8 80.0 - 100.0 fL   MCH 27.8 27.0 - 33.0 pg   MCHC 32.8 32.0 - 36.0 g/dL   RDW 20.1 00.7 - 12.1 %   Platelets 445 (H) 140 - 400 Thousand/uL   MPV 11.7 7.5 - 12.5 fL  TSH   Collection Time: 02/27/19  1:45 PM  Result Value Ref Range   TSH 1.87 mIU/L   No results found.     Assessment and Plan:     DUB- seems to have responded to metformin. She would like to continue this. She  would take OCPs only as a last resort. Anxiety- rec increase Lexapro to 20 mg at night. Rec speak with Asher Muir.   I discussed the assessment and treatment plan with the patient. The patient was provided an opportunity to ask questions and all were answered. The patient agreed with the plan and demonstrated an understanding of the instructions.   The patient was advised to call back or seek an in-person evaluation/go to the ED if the symptoms worsen or if the condition fails to improve as anticipated.  I provided  15 minutes of face-to-face via WebEx time during this encounter.   Allie Bossier, MD [facility] [employer], [facility]

## 2019-03-02 NOTE — Progress Notes (Signed)
Pt states bleeding is better and she is doing well on Lexapro

## 2019-03-28 ENCOUNTER — Other Ambulatory Visit: Payer: Self-pay | Admitting: Obstetrics & Gynecology

## 2019-06-12 ENCOUNTER — Ambulatory Visit: Payer: BC Managed Care – PPO | Admitting: Obstetrics & Gynecology

## 2019-07-03 ENCOUNTER — Other Ambulatory Visit: Payer: Self-pay

## 2019-07-03 ENCOUNTER — Ambulatory Visit (INDEPENDENT_AMBULATORY_CARE_PROVIDER_SITE_OTHER): Payer: BC Managed Care – PPO | Admitting: Obstetrics & Gynecology

## 2019-07-03 ENCOUNTER — Encounter: Payer: Self-pay | Admitting: Obstetrics & Gynecology

## 2019-07-03 VITALS — BP 119/80 | HR 60 | Ht 64.0 in | Wt 247.0 lb

## 2019-07-03 DIAGNOSIS — Z23 Encounter for immunization: Secondary | ICD-10-CM | POA: Diagnosis not present

## 2019-07-03 DIAGNOSIS — Z1151 Encounter for screening for human papillomavirus (HPV): Secondary | ICD-10-CM | POA: Diagnosis not present

## 2019-07-03 DIAGNOSIS — Z01419 Encounter for gynecological examination (general) (routine) without abnormal findings: Secondary | ICD-10-CM | POA: Diagnosis not present

## 2019-07-03 DIAGNOSIS — Z124 Encounter for screening for malignant neoplasm of cervix: Secondary | ICD-10-CM

## 2019-07-03 NOTE — Progress Notes (Signed)
Pt is here for annual gyn exam. Pt reports last pap may have been around 2016, normal. Condoms for contraception.

## 2019-07-03 NOTE — Progress Notes (Signed)
Subjective:    Joanna Banks is a 33 y.o. married P3 (41, 45, and 60 yo kids) who presents for an annual exam. The patient has no complaints today. The patient is sexually active. GYN screening history: last pap: was normal. The patient wears seatbelts: yes. The patient participates in regular exercise: yes. Has the patient ever been transfused or tattooed?: yes. The patient reports that there is not domestic violence in her life.   Menstrual History: OB History    Gravida  3   Para  3   Term  3   Preterm      AB      Living  3     SAB      TAB      Ectopic      Multiple  0   Live Births  1           Menarche age: 16 No LMP recorded.    The following portions of the patient's history were reviewed and updated as appropriate: allergies, current medications, past family history, past medical history, past social history, past surgical history and problem list.  Review of Systems Pertinent items are noted in HPI.   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 Husband has an appt to get a vasectomy.   Objective:    BP 119/80   Pulse 60   Ht 5\' 4"  (1.626 m)   Wt 247 lb (112 kg)   BMI 42.40 kg/m   General Appearance:    Alert, cooperative, no distress, appears stated age  Head:    Normocephalic, without obvious abnormality, atraumatic  Eyes:    PERRL, conjunctiva/corneas clear, EOM's intact, fundi    benign, both eyes  Ears:    Normal TM's and external ear canals, both ears  Nose:   Nares normal, septum midline, mucosa normal, no drainage    or sinus tenderness  Throat:   Lips, mucosa, and tongue normal; teeth and gums normal  Neck:   Supple, symmetrical, trachea midline, no adenopathy;    thyroid:  no enlargement/tenderness/nodules; no carotid   bruit or JVD  Back:     Symmetric, no curvature, ROM normal, no CVA tenderness  Lungs:     Clear to  auscultation bilaterally, respirations unlabored  Chest Wall:    No tenderness or deformity   Heart:    Regular rate and rhythm, S1 and S2 normal, no murmur, rub   or gallop  Breast Exam:    No tenderness, masses, or nipple abnormality  Abdomen:     Soft, non-tender, bowel sounds active all four quadrants,    no masses, no organomegaly  Genitalia:    Normal female without lesion, discharge or tenderness, normal size and shape, anteverted, mobile, non-tender, normal adnexal exam      Extremities:   Extremities normal, atraumatic, no cyanosis or edema  Pulses:   2+ and symmetric all extremities  Skin:   Skin color, texture, turgor normal, no rashes or lesions  Lymph nodes:   Cervical, supraclavicular, and axillary nodes normal  Neurologic:   CNII-XII intact, normal strength, sensation and reflexes    throughout  .    Assessment:    Healthy female exam.    Plan:     Thin prep Pap smear. with cotesting Flu vaccine today

## 2019-07-05 LAB — CYTOLOGY - PAP
Adequacy: ABSENT
Diagnosis: NEGATIVE
HPV: NOT DETECTED

## 2019-08-06 ENCOUNTER — Other Ambulatory Visit: Payer: Self-pay | Admitting: Obstetrics & Gynecology

## 2019-11-15 ENCOUNTER — Other Ambulatory Visit: Payer: Self-pay

## 2019-11-15 ENCOUNTER — Encounter: Payer: Self-pay | Admitting: Medical

## 2019-11-15 ENCOUNTER — Ambulatory Visit: Payer: BC Managed Care – PPO | Admitting: Medical

## 2019-11-15 VITALS — BP 119/78 | HR 60 | Temp 95.1°F | Resp 16 | Ht 64.0 in | Wt 251.4 lb

## 2019-11-15 DIAGNOSIS — E282 Polycystic ovarian syndrome: Secondary | ICD-10-CM

## 2019-11-15 DIAGNOSIS — R112 Nausea with vomiting, unspecified: Secondary | ICD-10-CM

## 2019-11-15 DIAGNOSIS — F419 Anxiety disorder, unspecified: Secondary | ICD-10-CM | POA: Diagnosis not present

## 2019-11-15 DIAGNOSIS — R109 Unspecified abdominal pain: Secondary | ICD-10-CM | POA: Diagnosis not present

## 2019-11-15 LAB — COMPREHENSIVE METABOLIC PANEL
ALT: 15 U/L (ref 0–35)
AST: 18 U/L (ref 0–37)
Albumin: 4.5 g/dL (ref 3.5–5.2)
Alkaline Phosphatase: 66 U/L (ref 39–117)
BUN: 17 mg/dL (ref 6–23)
CO2: 28 mEq/L (ref 19–32)
Calcium: 9.5 mg/dL (ref 8.4–10.5)
Chloride: 102 mEq/L (ref 96–112)
Creatinine, Ser: 0.7 mg/dL (ref 0.40–1.20)
GFR: 95.76 mL/min (ref >60.00)
Glucose, Bld: 93 mg/dL (ref 70–99)
Potassium: 4 mEq/L (ref 3.5–5.1)
Sodium: 136 mEq/L (ref 135–145)
Total Bilirubin: 0.3 mg/dL (ref 0.2–1.2)
Total Protein: 7.5 g/dL (ref 6.0–8.3)

## 2019-11-15 LAB — CBC WITH DIFFERENTIAL/PLATELET
Basophils Absolute: 0.1 10*3/uL (ref 0.0–0.1)
Basophils Relative: 0.4 % (ref 0.0–3.0)
Eosinophils Absolute: 0 10*3/uL (ref 0.0–0.7)
Eosinophils Relative: 0.3 % (ref 0.0–5.0)
HCT: 34.5 % — ABNORMAL LOW (ref 36.0–46.0)
Hemoglobin: 10.9 g/dL — ABNORMAL LOW (ref 12.0–15.0)
Lymphocytes Relative: 15 % (ref 12.0–46.0)
Lymphs Abs: 2 10*3/uL (ref 0.7–4.0)
MCHC: 31.4 g/dL (ref 30.0–36.0)
MCV: 75.3 fl — ABNORMAL LOW (ref 78.0–100.0)
Monocytes Absolute: 0.7 10*3/uL (ref 0.1–1.0)
Monocytes Relative: 5.3 % (ref 3.0–12.0)
Neutro Abs: 10.3 10*3/uL — ABNORMAL HIGH (ref 1.4–7.7)
Neutrophils Relative %: 79 % — ABNORMAL HIGH (ref 43.0–77.0)
Platelets: 413 10*3/uL — ABNORMAL HIGH (ref 150.0–400.0)
RBC: 4.59 Mil/uL (ref 3.87–5.11)
RDW: 15.3 % (ref 11.5–15.5)
WBC: 13.1 10*3/uL — ABNORMAL HIGH (ref 4.0–10.5)

## 2019-11-15 LAB — AMYLASE: Amylase: 28 U/L (ref 27–131)

## 2019-11-15 LAB — LIPASE: Lipase: 30 U/L (ref 11.0–59.0)

## 2019-11-15 MED ORDER — ONDANSETRON 4 MG PO TBDP: 4.0000 mg | ORAL_TABLET | Freq: Three times a day (TID) | ORAL | 0 refills | Status: DC | PRN

## 2019-11-15 MED ORDER — FAMOTIDINE 20 MG PO TABS: 20.0000 mg | ORAL_TABLET | Freq: Two times a day (BID) | ORAL | 0 refills | Status: DC

## 2019-11-15 NOTE — Patient Instructions (Signed)
You do have history of recent abdomen pain on the severe side followed by episode of nausea and vomiting.  On exam pain is minimal and present over right upper quadrant/gallbladder area.  Today pain after eating lunch causes me to be concerned for gallbladder etiology.  We will get a CBC and metabolic panel today stat.  Place order for abdomen ultrasound to be done stat.  Patient is nonfasting so requesting that it be done tomorrow morning.  If she has severe abdomen pain/worsening signs/symptoms prior to ultrasound then recommend ED evaluation.  History of PCOS and she is on Metformin.  Anxiety controlled with Lexapro.  Follow-up in 7 days or as needed.

## 2019-11-15 NOTE — Progress Notes (Signed)
Subjective:    Patient ID: Joanna Banks, female    DOB: September 10, 1986, 33 y.o.   MRN: [id number]  HPI  Pt in for first time.  Pt works at [employer], exercises 2-3 times a week, Pt states healthy diet, nonsmoker, no alcohol use. Married- 3 children.  Pt with some recent pain in rt side abdomen that at times radiate uper rt side rib toward back area and sometimes toward stomach.  She states around christmas she has severe bout of pain for about 45 minutes. Then 3 weeks ago had another event of severe pain that last for about 2 hours then subsided.   Then today after lunch started to have pain, vomited twice and then pain lasted for about 1.5 hours.  Pt ate salad today with [place], rasberry, and ranch dressing.  No hx of surgery except c section.  lmp- jan 17,2021.  Pt has pcos On metformin.  Anxiety- controlled with lexapro.    Review of Systems  Constitutional: Negative for chills, fatigue and fever.  HENT: Negative for congestion.   Respiratory: Negative for cough, chest tightness, shortness of breath and wheezing.   Cardiovascular: Negative for chest pain and palpitations.  Gastrointestinal: Positive for abdominal pain.       Pt current level of pain about 1/10. Earlier today was level 8/10 pain.  Musculoskeletal: Negative for back pain.  Psychiatric/Behavioral: Negative for behavioral problems.    Past Medical History:  Diagnosis Date  . PCOS (polycystic ovarian syndrome)      Social History   Socioeconomic History  . Marital status: Married    Spouse name: Not on file  . Number of children: Not on file  . Years of education: Not on file  . Highest education level: Not on file  Occupational History  . Not on file  Tobacco Use  . Smoking status: Former Smoker    Types: Cigarettes  . Smokeless tobacco: Never Used  Substance and Sexual Activity  . Alcohol use: No  . Drug use: No  . Sexual activity: Yes    Birth control/protection: Condom  Other  Topics Concern  . Not on file  Social History Narrative  . Not on file   Social Determinants of Health   Financial Resource Strain:   . Difficulty of Paying Living Expenses: Not on file  Food Insecurity:   . Worried About Charity fundraiser in the Last Year: Not on file  . Ran Out of Food in the Last Year: Not on file  Transportation Needs:   . Lack of Transportation (Medical): Not on file  . Lack of Transportation (Non-Medical): Not on file  Physical Activity:   . Days of Exercise per Week: Not on file  . Minutes of Exercise per Session: Not on file  Stress:   . Feeling of Stress : Not on file  Social Connections:   . Frequency of Communication with Friends and Family: Not on file  . Frequency of Social Gatherings with Friends and Family: Not on file  . Attends Religious Services: Not on file  . Active Member of Clubs or Organizations: Not on file  . Attends Archivist Meetings: Not on file  . Marital Status: Not on file  Intimate Partner Violence:   . Fear of Current or Ex-Partner: Not on file  . Emotionally Abused: Not on file  . Physically Abused: Not on file  . Sexually Abused: Not on file    Past Surgical History:  Procedure Laterality  Date  . CESAREAN SECTION      Family History  Problem Relation Age of Onset  . Breast cancer Maternal Aunt   . Lung cancer Maternal Grandfather   . Lung cancer Paternal Grandfather     Allergies  Allergen Reactions  . Sulfa Antibiotics Hives    Current Outpatient Medications on File Prior to Visit  Medication Sig Dispense Refill  . acetaminophen (TYLENOL) 325 MG tablet Take 650 mg by mouth every 6 (six) hours as needed for headache.    . escitalopram (LEXAPRO) 20 MG tablet TAKE 1 TABLET BY MOUTH AT BEDTIME 90 tablet 5  . metFORMIN (GLUCOPHAGE) 500 MG tablet TAKE 1 TABLET (500 MG TOTAL) BY MOUTH 2 (TWO) TIMES DAILY WITH A MEAL. 180 tablet 2   No current facility-administered medications on file prior to visit.     BP 119/78 (BP Location: Left Arm, Patient Position: Sitting, Cuff Size: Large)   Pulse 60   Temp (!) 95.1 F (35.1 C) (Temporal)   Resp 16   Ht 5\' 4"  (1.626 m)   Wt 251 lb 6.4 oz (114 kg)   SpO2 99%   BMI 43.15 kg/m       Objective:   Physical Exam  General- No acute distress. Pleasant patient. Neck- Full range of motion, no jvd Lungs- Clear, even and unlabored. Heart- regular rate and rhythm. Neurologic- CNII- XII grossly intact. Abdomen- soft, nd, mild tendern rt upper quadrant tenderness, no masses. No rebound or guarding. +bs. (report/describes pain in ruq seems to radiate toward latisimuss) Derm-on inspection there is no rash on abdomen or posterior thorax.      Assessment & Plan:  You do have history of recent abdomen pain on the severe side followed by episode of nausea and vomiting.  On exam pain is minimal and present over right upper quadrant/gallbladder area.  Today pain after eating lunch causes me to be concerned for gallbladder etiology.  We will get a CBC and metabolic panel today stat.  Place order for abdomen ultrasound to be done stat.  Patient is nonfasting so requesting that it be done tomorrow morning.  If she has severe abdomen pain/worsening signs/symptoms prior to ultrasound then recommend ED evaluation.  History of PCOS and she is on Metformin.  Anxiety controlled with Lexapro.  Follow-up in 7 days or as needed.  , PA-C

## 2019-11-16 ENCOUNTER — Other Ambulatory Visit (INDEPENDENT_AMBULATORY_CARE_PROVIDER_SITE_OTHER): Payer: BC Managed Care – PPO

## 2019-11-16 ENCOUNTER — Telehealth: Payer: Self-pay | Admitting: Medical

## 2019-11-16 ENCOUNTER — Ambulatory Visit (HOSPITAL_BASED_OUTPATIENT_CLINIC_OR_DEPARTMENT_OTHER)
Admission: RE | Admit: 2019-11-16 | Discharge: 2019-11-16 | Disposition: A | Discharge status: Home / Self Care | Payer: BC Managed Care – PPO | Source: Ambulatory Visit | Attending: Medical | Admitting: Medical

## 2019-11-16 DIAGNOSIS — D649 Anemia, unspecified: Secondary | ICD-10-CM

## 2019-11-16 DIAGNOSIS — R109 Unspecified abdominal pain: Secondary | ICD-10-CM | POA: Insufficient documentation

## 2019-11-16 DIAGNOSIS — K8020 Calculus of gallbladder without cholecystitis without obstruction: Secondary | ICD-10-CM

## 2019-11-16 LAB — IBC + FERRITIN
Ferritin: 3.3 ng/mL — ABNORMAL LOW (ref 10.0–291.0)
Iron: 22 ug/dL — ABNORMAL LOW (ref 42–145)
Saturation Ratios: 4.7 % — ABNORMAL LOW (ref 20.0–50.0)
Transferrin: 334 mg/dL (ref 212.0–360.0)

## 2019-11-16 MED ORDER — FERROUS SULFATE 325 (65 FE) MG PO TBEC: 325.0000 mg | DELAYED_RELEASE_TABLET | Freq: Three times a day (TID) | ORAL | 1 refills | Status: DC

## 2019-11-16 MED ORDER — CIPROFLOXACIN HCL 500 MG PO TABS: 500.0000 mg | ORAL_TABLET | Freq: Two times a day (BID) | ORAL | 0 refills | Status: DC

## 2019-11-16 NOTE — Telephone Encounter (Signed)
Rx iron sent to pt pharmacy. °

## 2019-11-16 NOTE — Telephone Encounter (Signed)
Referral to surgeon placed. 

## 2019-11-20 ENCOUNTER — Other Ambulatory Visit: Payer: Self-pay

## 2019-11-20 ENCOUNTER — Other Ambulatory Visit (INDEPENDENT_AMBULATORY_CARE_PROVIDER_SITE_OTHER): Payer: BC Managed Care – PPO

## 2019-11-20 DIAGNOSIS — D649 Anemia, unspecified: Secondary | ICD-10-CM

## 2019-11-21 LAB — CBC WITH DIFFERENTIAL/PLATELET
Basophils Absolute: 0.1 10*3/uL (ref 0.0–0.1)
Basophils Relative: 0.8 % (ref 0.0–3.0)
Eosinophils Absolute: 0.1 10*3/uL (ref 0.0–0.7)
Eosinophils Relative: 0.4 % (ref 0.0–5.0)
HCT: 35.4 % — ABNORMAL LOW (ref 36.0–46.0)
Hemoglobin: 11.2 g/dL — ABNORMAL LOW (ref 12.0–15.0)
Lymphocytes Relative: 28.6 % (ref 12.0–46.0)
Lymphs Abs: 3.9 10*3/uL (ref 0.7–4.0)
MCHC: 31.5 g/dL (ref 30.0–36.0)
MCV: 75.7 fl — ABNORMAL LOW (ref 78.0–100.0)
Monocytes Absolute: 0.6 10*3/uL (ref 0.1–1.0)
Monocytes Relative: 4.6 % (ref 3.0–12.0)
Neutro Abs: 8.9 10*3/uL — ABNORMAL HIGH (ref 1.4–7.7)
Neutrophils Relative %: 65.6 % (ref 43.0–77.0)
Platelets: 486 10*3/uL — ABNORMAL HIGH (ref 150.0–400.0)
RBC: 4.67 Mil/uL (ref 3.87–5.11)
RDW: 15.8 % — ABNORMAL HIGH (ref 11.5–15.5)
WBC: 13.6 10*3/uL — ABNORMAL HIGH (ref 4.0–10.5)

## 2019-11-23 ENCOUNTER — Encounter: Payer: Self-pay | Admitting: Medical

## 2019-11-23 ENCOUNTER — Other Ambulatory Visit (INDEPENDENT_AMBULATORY_CARE_PROVIDER_SITE_OTHER): Payer: BC Managed Care – PPO

## 2019-11-23 DIAGNOSIS — D649 Anemia, unspecified: Secondary | ICD-10-CM | POA: Diagnosis not present

## 2019-11-23 LAB — FECAL OCCULT BLOOD, IMMUNOCHEMICAL: Fecal Occult Bld: NEGATIVE

## 2019-11-24 ENCOUNTER — Ambulatory Visit: Payer: Self-pay | Admitting: General Surgery

## 2019-11-24 ENCOUNTER — Other Ambulatory Visit: Payer: Self-pay | Admitting: Medical

## 2019-12-01 ENCOUNTER — Ambulatory Visit: Payer: BC Managed Care – PPO | Admitting: Medical

## 2019-12-11 ENCOUNTER — Other Ambulatory Visit: Payer: Self-pay | Admitting: General Surgery

## 2020-05-01 ENCOUNTER — Ambulatory Visit: Payer: BC Managed Care – PPO | Admitting: Family Medicine

## 2020-05-01 ENCOUNTER — Other Ambulatory Visit: Payer: Self-pay

## 2020-05-01 ENCOUNTER — Encounter: Payer: Self-pay | Admitting: Family Medicine

## 2020-05-01 VITALS — BP 118/80 | HR 80 | Temp 99.4°F | Ht 64.0 in | Wt 259.0 lb

## 2020-05-01 DIAGNOSIS — R590 Localized enlarged lymph nodes: Secondary | ICD-10-CM

## 2020-05-01 NOTE — Progress Notes (Signed)
Musculoskeletal Exam  Patient: Joanna Banks DOB: December 10, 1985  DOS: 05/01/2020  SUBJECTIVE:  Chief Complaint:   Chief Complaint  Patient presents with  . left side of neck swollen and sore    Joanna Banks is a 34 y.o.  female for evaluation and treatment of neck pain.   Onset:  1 day ago. No inj or change in activity.  Location: L side of nec Character:  soreness  Progression of issue:  is unchanged Associated symptoms: Nickel-sized lump and swelling, allergies; no redness, bruising, drainage, no itching, fevers, recent illness Treatment: to date has been warm compress Neurovascular symptoms: no  Past Medical History:  Diagnosis Date  . PCOS (polycystic ovarian syndrome)     Objective: VITAL SIGNS: BP 118/80 (BP Location: Left Arm, Patient Position: Sitting, Cuff Size: Normal)   Pulse 80   Temp 99.4 F (37.4 C) (Oral)   Ht 5\' 4"  (1.626 m)   Wt 259 lb (117.5 kg)   SpO2 98%   BMI 44.46 kg/m  Constitutional: Well formed, well developed. No acute distress. Thorax & Lungs: No accessory muscle use Musculoskeletal: Neck.   Normal active range of motion: yes.   Normal passive range of motion: yes Tenderness to palpation: yes Deformity: see skin exam Ecchymosis: No Tests positive: None Tests negative: Spurling's Skin: 2 cm x 1.2 cm on L side of neck that is ttp, freely moveable with smooth borders, no erythema Psychiatric: Normal mood. Age appropriate judgment and insight. Alert & oriented x 3.    Assessment:  Enlarged lymph node in neck - Plan: CBC w/Diff  Plan: Watchful waiting, heat, ice, Tylenol. If no improvement over next 6-8 weeks, will ck CT soft tissue neck.  Warning signs and symptoms verbalized and written down in AVS.  F/u prn. The patient voiced understanding and agreement to the plan.   [place], DO 05/01/20  3:23 PM

## 2020-05-01 NOTE — Patient Instructions (Addendum)
Ice/cold pack over area for 10-15 min twice daily.  Heat (pad or rice pillow in microwave) over affected area, 10-15 minutes twice daily.   OK to take Tylenol 1000 mg (2 extra strength tabs) or 975 mg (3 regular strength tabs) every 6 hours as needed.  Ibuprofen 400-600 mg (2-3 over the counter strength tabs) every 6 hours as needed for pain.  Let's wait 2 months for this to resolve spontaneously. If it does not, let me know. If it gets bigger, more painful, or if you spike a fever, let me know.  Give [place] 2-3 business days to get the results of your labs back.   Stop using prescription strength steroid cream on your face. Just use over the counter strength if you need it.   Let us know if you need anything.

## 2020-05-02 LAB — CBC WITH DIFFERENTIAL/PLATELET
Basophils Absolute: 0.2 10*3/uL — ABNORMAL HIGH (ref 0.0–0.1)
Basophils Relative: 1.3 % (ref 0.0–3.0)
Eosinophils Absolute: 0.1 10*3/uL (ref 0.0–0.7)
Eosinophils Relative: 0.9 % (ref 0.0–5.0)
HCT: 32.4 % — ABNORMAL LOW (ref 36.0–46.0)
Hemoglobin: 10.1 g/dL — ABNORMAL LOW (ref 12.0–15.0)
Lymphocytes Relative: 24.8 % (ref 12.0–46.0)
Lymphs Abs: 3.1 10*3/uL (ref 0.7–4.0)
MCHC: 31.3 g/dL (ref 30.0–36.0)
MCV: 69.7 fl — ABNORMAL LOW (ref 78.0–100.0)
Monocytes Absolute: 0.8 10*3/uL (ref 0.1–1.0)
Monocytes Relative: 6.1 % (ref 3.0–12.0)
Neutro Abs: 8.4 10*3/uL — ABNORMAL HIGH (ref 1.4–7.7)
Neutrophils Relative %: 66.9 % (ref 43.0–77.0)
Platelets: 466 10*3/uL — ABNORMAL HIGH (ref 150.0–400.0)
RBC: 4.65 Mil/uL (ref 3.87–5.11)
RDW: 17.6 % — ABNORMAL HIGH (ref 11.5–15.5)
WBC: 12.6 10*3/uL — ABNORMAL HIGH (ref 4.0–10.5)

## 2020-05-19 ENCOUNTER — Telehealth: Payer: BC Managed Care – PPO | Admitting: Family

## 2020-05-19 ENCOUNTER — Encounter: Payer: Self-pay | Admitting: Emergency Medicine

## 2020-05-19 ENCOUNTER — Emergency Department
Admission: EM | Admit: 2020-05-19 | Discharge: 2020-05-19 | Disposition: A | Discharge status: Home / Self Care | Payer: BC Managed Care – PPO | Source: Home / Self Care | Attending: Family Medicine | Admitting: Family Medicine

## 2020-05-19 ENCOUNTER — Other Ambulatory Visit: Payer: Self-pay

## 2020-05-19 DIAGNOSIS — R109 Unspecified abdominal pain: Secondary | ICD-10-CM

## 2020-05-19 DIAGNOSIS — R399 Unspecified symptoms and signs involving the genitourinary system: Secondary | ICD-10-CM

## 2020-05-19 DIAGNOSIS — N3000 Acute cystitis without hematuria: Secondary | ICD-10-CM

## 2020-05-19 DIAGNOSIS — R300 Dysuria: Secondary | ICD-10-CM | POA: Diagnosis not present

## 2020-05-19 LAB — POCT URINALYSIS DIP (MANUAL ENTRY)
Bilirubin, UA: NEGATIVE
Glucose, UA: NEGATIVE mg/dL
Ketones, POC UA: NEGATIVE mg/dL
Nitrite, UA: POSITIVE — AB
Protein Ur, POC: 30 mg/dL — AB
Spec Grav, UA: 1.02 (ref 1.010–1.025)
Urobilinogen, UA: 1 E.U./dL
pH, UA: 5.5 (ref 5.0–8.0)

## 2020-05-19 MED ORDER — NITROFURANTOIN MONOHYD MACRO 100 MG PO CAPS: ORAL_CAPSULE | ORAL | 0 refills | Status: DC

## 2020-05-19 NOTE — ED Triage Notes (Signed)
Patient reports onset of dysuria and frequency of urination yesterday; did take AZO. Has had covid vaccination.

## 2020-05-19 NOTE — Discharge Instructions (Addendum)
Increase fluid intake. May use non-prescription AZO for about two days, if desired, to decrease urinary discomfort.  If symptoms become significantly worse during the night or over the weekend, proceed to the local emergency room.  

## 2020-05-19 NOTE — ED Provider Notes (Signed)
[facility]    CSN: [id number] Arrival date & time: 05/19/20  1256      History   Chief Complaint Chief Complaint  Patient presents with   Dysuria   Urinary Frequency    HPI Joanna Banks is a 34 y.o. female.   Yesterday patient developed dysuria, frequency, and mid low back ache.  She denies pelvic/abdominal pain and fevers, chills, and sweats.  She feels well otherwise.  Patient's last menstrual period was 05/08/2020 (exact date).   The history is provided by the patient.  Dysuria Pain quality:  Burning Pain severity:  Mild Onset quality:  Sudden Duration:  1 day Timing:  Constant Progression:  Worsening Chronicity:  New Recent urinary tract infections: no   Relieved by:  Phenazopyridine Worsened by:  Nothing Ineffective treatments:  None tried Urinary symptoms: frequent urination and hesitancy   Urinary symptoms: no discolored urine, no foul-smelling urine, no hematuria and no bladder incontinence   Associated symptoms: no abdominal pain, no fever, no flank pain, no nausea and no vaginal discharge   Risk factors: no recurrent urinary tract infections   Urinary Frequency Pertinent negatives include no abdominal pain.    Past Medical History:  Diagnosis Date   PCOS (polycystic ovarian syndrome)     There are no problems to display for this patient.   Past Surgical History:  Procedure Laterality Date   CESAREAN SECTION     CHOLECYSTECTOMY      OB History    Gravida  3   Para  3   Term  3   Preterm      AB      Living  3     SAB      TAB      Ectopic      Multiple  0   Live Births  1            Home Medications    Prior to Admission medications   Medication Sig Start Date End Date Taking? Authorizing Provider  escitalopram (LEXAPRO) 20 MG tablet TAKE 1 TABLET BY MOUTH AT BEDTIME 03/28/19   Dove, Myra C, MD  metFORMIN (GLUCOPHAGE) 500 MG tablet TAKE 1 TABLET (500 MG TOTAL) BY MOUTH 2 (TWO) TIMES DAILY WITH A  MEAL. 08/07/19   Allie Bossier, MD  nitrofurantoin, macrocrystal-monohydrate, (MACROBID) 100 MG capsule Take one cap PO Q12hr with food. 05/19/20   Lattie Haw, MD    Family History Family History  Problem Relation Age of Onset   Breast cancer Maternal Aunt    Lung cancer Maternal Grandfather    Lung cancer Paternal Grandfather     Social History Social History   Tobacco Use   Smoking status: Former Smoker    Types: Cigarettes   Smokeless tobacco: Never Used  Substance Use Topics   Alcohol use: No   Drug use: No     Allergies   Sulfa antibiotics   Review of Systems Review of Systems  Constitutional: Negative for activity change, appetite change, chills, diaphoresis, fatigue and fever.  Gastrointestinal: Negative for abdominal pain and nausea.  Genitourinary: Positive for dysuria, frequency and urgency. Negative for flank pain, hematuria and vaginal discharge.  All other systems reviewed and are negative.    Physical Exam Triage Vital Signs ED Triage Vitals  Enc Vitals Group     BP 05/19/20 1317 (!) 135/89     Pulse Rate 05/19/20 1317 78     Resp 05/19/20 1317 16  Temp 05/19/20 1317 98.8 F (37.1 C)     Temp Source 05/19/20 1317 Oral     SpO2 05/19/20 1317 96 %     Weight 05/19/20 1318 (!) 250 lb (113.4 kg)     Height 05/19/20 1318 5\' 4"  (1.626 m)     Head Circumference --      Peak Flow --      Pain Score 05/19/20 1318 4     Pain Loc --      Pain Edu? --      Excl. in GC? --    No data found.  Updated Vital Signs BP (!) 135/89 (BP Location: Right Wrist)    Pulse 78    Temp 98.8 F (37.1 C) (Oral)    Resp 16    Ht 5\' 4"  (1.626 m)    Wt (!) 113.4 kg    LMP 05/08/2020 (Exact Date)    SpO2 96%    BMI 42.91 kg/m   Visual Acuity Right Eye Distance:   Left Eye Distance:   Bilateral Distance:    Right Eye Near:   Left Eye Near:    Bilateral Near:     Physical Exam Nursing notes and Vital Signs reviewed. Appearance:  Patient appears  stated age, and in no acute distress.    Eyes:  Pupils are equal, round, and reactive to light and accomodation.  Extraocular movement is intact.  Conjunctivae are not inflamed   Pharynx:  Normal; moist mucous membranes  Neck:  Supple.  No adenopathy Lungs:  Clear to auscultation.  Breath sounds are equal.  Moving air well. Heart:  Regular rate and rhythm without murmurs, rubs, or gallops.  Abdomen:  Nontender without masses or hepatosplenomegaly.  Bowel sounds are present.  No CVA or flank tenderness.  Extremities:  No edema.  Skin:  No rash present.     UC Treatments / Results  Labs (all labs ordered are listed, but only abnormal results are displayed) Labs Reviewed  POCT URINALYSIS DIP (MANUAL ENTRY) - Abnormal; Notable for the following components:      Result Value   Blood, UA moderate (*)    Protein Ur, POC =30 (*)    Nitrite, UA Positive (*)    Leukocytes, UA Small (1+) (*)    All other components within normal limits  URINE CULTURE    EKG   Radiology No results found.  Procedures Procedures (including critical care time)  Medications Ordered in UC Medications - No data to display  Initial Impression / Assessment and Plan / UC Course  I have reviewed the triage vital signs and the nursing notes.  Pertinent labs & imaging results that were available during my care of the patient were reviewed by me and considered in my medical decision making (see chart for details).    Urine culture pending.  Begin Macrobid. Followup with Family Doctor if not improved in 8 days.   Final Clinical Impressions(s) / UC Diagnoses   Final diagnoses:  Dysuria  Acute cystitis without hematuria     Discharge Instructions     Increase fluid intake. May use non-prescription AZO for about two days, if desired, to decrease urinary discomfort.   If symptoms become significantly worse during the night or over the weekend, proceed to the local emergency room.     ED Prescriptions     Medication Sig Dispense Auth. Provider   nitrofurantoin, macrocrystal-monohydrate, (MACROBID) 100 MG capsule Take one cap PO Q12hr with food. 14 capsule  A, MD        Lattie Haw, MD 05/20/20 1538

## 2020-05-19 NOTE — Progress Notes (Signed)
Based on what you shared with me, I feel your condition warrants further evaluation and I recommend that you be seen for a face to face office visit.  Given your symptoms of a UTI with back pain and fever you need to be seen face to face to rule out a more serious infection.    NOTE: If you entered your credit card information for this eVisit, you will not be charged. You may see a "hold" on your card for the $35 but that hold will drop off and you will not have a charge processed.   If you are having a true medical emergency please call 911.      For an urgent face to face visit, [place] has five urgent care centers for your convenience:      NEW:  [facility] at [facility] Directions [phone] [address] [place], [place] [address] . 10 am - 6pm Monday - Friday    [facility]) Get Driving Directions [phone] [address] [place], [place] [address] . 10 am to 8 pm Monday-Friday . 12 pm to 8 pm [facility]gent Care at [facility] Get Driving Directions [phone] [address], [address] [place], [place] [address] . 8 am to 8 pm Monday-Friday . 9 am to 6 pm Saturday . 11 am to 6 pm Sunday     [facility] at [facility] Get Driving Directions  [phone] [address] [place], [place] [address] . 8 am to 8 pm Monday-Friday . 8 am to 4 pm [facility] Care at [facility] Directions [phone] [address] [place], [place] [address] . 12 pm to 6 pm Monday-Friday      Your e-visit answers were reviewed by a board certified advanced clinical practitioner to complete your personal care plan.  Thank you for using e-Visits.

## 2020-05-22 LAB — URINE CULTURE
MICRO NUMBER:: 10775084
SPECIMEN QUALITY:: ADEQUATE

## 2020-06-17 ENCOUNTER — Telehealth: Payer: Self-pay | Admitting: *Deleted

## 2020-06-17 NOTE — Telephone Encounter (Signed)
Left patient a message to call and schedule annual a year and a day after 07/02/2020. Patient informed that Dr. Marice Potter retired from [place] in March.

## 2020-10-10 ENCOUNTER — Encounter: Payer: Self-pay | Admitting: Obstetrics and Gynecology

## 2020-10-10 ENCOUNTER — Other Ambulatory Visit (HOSPITAL_COMMUNITY)
Admission: RE | Admit: 2020-10-10 | Discharge: 2020-10-10 | Disposition: A | Discharge status: Home / Self Care | Payer: BC Managed Care – PPO | Source: Ambulatory Visit | Attending: Obstetrics and Gynecology | Admitting: Obstetrics and Gynecology

## 2020-10-10 ENCOUNTER — Ambulatory Visit (INDEPENDENT_AMBULATORY_CARE_PROVIDER_SITE_OTHER): Payer: BC Managed Care – PPO | Admitting: Obstetrics and Gynecology

## 2020-10-10 ENCOUNTER — Other Ambulatory Visit: Payer: Self-pay

## 2020-10-10 VITALS — BP 121/73 | HR 80 | Ht 64.0 in | Wt 228.0 lb

## 2020-10-10 DIAGNOSIS — E282 Polycystic ovarian syndrome: Secondary | ICD-10-CM

## 2020-10-10 DIAGNOSIS — Z113 Encounter for screening for infections with a predominantly sexual mode of transmission: Secondary | ICD-10-CM

## 2020-10-10 DIAGNOSIS — R102 Pelvic and perineal pain: Secondary | ICD-10-CM

## 2020-10-10 DIAGNOSIS — B379 Candidiasis, unspecified: Secondary | ICD-10-CM

## 2020-10-10 DIAGNOSIS — Z01419 Encounter for gynecological examination (general) (routine) without abnormal findings: Secondary | ICD-10-CM | POA: Diagnosis not present

## 2020-10-10 MED ORDER — METFORMIN HCL 500 MG PO TABS: 500.0000 mg | ORAL_TABLET | Freq: Two times a day (BID) | ORAL | 2 refills | Status: DC

## 2020-10-10 MED ORDER — FLUCONAZOLE 150 MG PO TABS: 150.0000 mg | ORAL_TABLET | Freq: Once | ORAL | 1 refills | Status: AC

## 2020-10-10 NOTE — Progress Notes (Signed)
GYNECOLOGY ANNUAL PREVENTATIVE CARE ENCOUNTER NOTE  Subjective:   Joanna Banks is a 34 y.o. [id number] female here for a annual gynecologic exam. Current complaints: yeast infection and painful/tender vulvar area with discharge after intercourse. They use condoms for contraception.  OTC treats and usually works but she is getting frustrated as it is making her reluctant to have intercourse.  Denies abnormal vaginal bleeding, pelvic pain, problems with intercourse or other gynecologic concerns. Accepts STI screen.   Gynecologic History Patient's last menstrual period was 09/18/2020. Contraception: condoms Last Pap: 2020. Results: normal Last mammogram: n/a  Obstetric History OB History  Gravida Para Term Preterm AB Living  3 3 3     3   SAB IAB Ectopic Multiple Live Births        0 1    # Outcome Date GA Lbr Len/2nd Weight Sex Delivery Anes PTL Lv  3 Term 05/13/18 [redacted]w[redacted]d 11:30 / 00:19 9 lb 14 oz (4.479 kg) M VBAC EPI  LIV  2 Term      Vag-Spont     1 Term      CS-LTranv       Past Medical History:  Diagnosis Date  . PCOS (polycystic ovarian syndrome)     Past Surgical History:  Procedure Laterality Date  . CESAREAN SECTION    . CHOLECYSTECTOMY      Current Outpatient Medications on File Prior to Visit  Medication Sig Dispense Refill  . escitalopram (LEXAPRO) 20 MG tablet TAKE 1 TABLET BY MOUTH AT BEDTIME (Patient not taking: Reported on 10/10/2020) 90 tablet 5  . nitrofurantoin, macrocrystal-monohydrate, (MACROBID) 100 MG capsule Take one cap PO Q12hr with food. (Patient not taking: Reported on 10/10/2020) 14 capsule 0   No current facility-administered medications on file prior to visit.    Allergies  Allergen Reactions  . Sulfa Antibiotics Hives    Social History   Socioeconomic History  . Marital status: Married    Spouse name: Not on file  . Number of children: Not on file  . Years of education: Not on file  . Highest education level: Not on file   Occupational History  . Not on file  Tobacco Use  . Smoking status: Former Smoker    Types: Cigarettes  . Smokeless tobacco: Never Used  Substance and Sexual Activity  . Alcohol use: No  . Drug use: No  . Sexual activity: Yes    Birth control/protection: Condom  Other Topics Concern  . Not on file  Social History Narrative  . Not on file   Social Determinants of Health   Financial Resource Strain: Not on file  Food Insecurity: Not on file  Transportation Needs: Not on file  Physical Activity: Not on file  Stress: Not on file  Social Connections: Not on file  Intimate Partner Violence: Not on file    Family History  Problem Relation Age of Onset  . Breast cancer Maternal Aunt   . Lung cancer Maternal Grandfather   . Lung cancer Paternal Grandfather     The following portions of the patient's history were reviewed and updated as appropriate: allergies, current medications, past family history, past medical history, past social history, past surgical history and problem list.  Review of Systems Pertinent items are noted in HPI.   Objective:  BP 121/73   Pulse 80   Ht 5\' 4"  (1.626 m)   Wt 228 lb (103.4 kg)   LMP 09/18/2020   Breastfeeding No   BMI 39.14  kg/m  CONSTITUTIONAL: Well-developed, well-nourished female in no acute distress.  HENT:  Normocephalic, atraumatic, External right and left ear normal. Oropharynx is clear and moist EYES: Conjunctivae and EOM are normal. Pupils are equal, round, and reactive to light. No scleral icterus.  NECK: Normal range of motion, supple, no masses.  Normal thyroid.  SKIN: Skin is warm and dry. No rash noted. Not diaphoretic. No erythema. No pallor. NEUROLOGIC: Alert and oriented to person, place, and time. Normal reflexes, muscle tone coordination. No cranial nerve deficit noted. PSYCHIATRIC: Normal mood and affect. Normal behavior. Normal judgment and thought content. CARDIOVASCULAR: Normal heart rate noted RESPIRATORY:  Effort normal, no problems with respiration noted. BREASTS: Symmetric in size. No masses, skin changes, nipple drainage, or lymphadenopathy. ABDOMEN: Soft, no distention noted.  No tenderness, rebound or guarding.  PELVIC: Normal appearing external genitalia; erythematous appearing vaginal mucosa and cervix.  Thick white discharge. Pelvic cultures obtained. Normal uterine size, no other palpable masses, no uterine or adnexal tenderness. MUSCULOSKELETAL: Normal range of motion. No tenderness.  No cyanosis, clubbing, or edema.   Exam done with chaperone present.  Assessment and Plan:   1. Well woman exam  2. Routine screening for STI (sexually transmitted infection) - Hepatitis B surface antigen - Hepatitis C antibody - HIV Antibody (routine testing w rflx) - RPR  3. PCOS (polycystic ovarian syndrome) Restart metformin  4. Vulvar pain - associated with intercourse, pt uses condoms - may be sensitivity/allergy to condoms or lubricant, try different brand/polyurethane versus latex and see if there is improvement - Cervicovaginal ancillary only( [place])  5. Yeast infection Diflucan sent to pharmayc   Will follow up results of STI screen and manage accordingly. Encouraged improvement in diet and exercise.  Accepts STI screen.  Routine preventative health maintenance measures emphasized. Please refer to After Visit Summary for other counseling recommendations.    Baldemar Lenis, M.D. Attending Center for [employer] [facility])

## 2020-10-10 NOTE — Progress Notes (Signed)
Last pap- 07/03/19- negative Pt c/o possible yeast infection. Pt feels like she gets yeast infection every time after intercourse.

## 2020-10-14 LAB — CERVICOVAGINAL ANCILLARY ONLY
Bacterial Vaginitis (gardnerella): NEGATIVE
Candida Glabrata: NEGATIVE
Candida Vaginitis: POSITIVE — AB
Chlamydia: NEGATIVE
Comment: NEGATIVE
Comment: NEGATIVE
Comment: NEGATIVE
Comment: NEGATIVE
Comment: NEGATIVE
Comment: NORMAL
Neisseria Gonorrhea: NEGATIVE
Trichomonas: NEGATIVE

## 2020-10-14 LAB — RPR: RPR Ser Ql: NONREACTIVE

## 2020-10-14 LAB — HEPATITIS C ANTIBODY
Hepatitis C Ab: NONREACTIVE
SIGNAL TO CUT-OFF: 0.04 (ref <1.00)

## 2020-10-14 LAB — HIV ANTIBODY (ROUTINE TESTING W REFLEX): HIV 1&2 Ab, 4th Generation: NONREACTIVE

## 2020-10-14 LAB — HEPATITIS B SURFACE ANTIGEN: Hepatitis B Surface Ag: NONREACTIVE

## 2020-12-10 ENCOUNTER — Ambulatory Visit: Payer: BC Managed Care – PPO | Admitting: Medical

## 2020-12-10 ENCOUNTER — Other Ambulatory Visit: Payer: Self-pay

## 2020-12-10 DIAGNOSIS — D649 Anemia, unspecified: Secondary | ICD-10-CM | POA: Diagnosis not present

## 2020-12-10 DIAGNOSIS — R109 Unspecified abdominal pain: Secondary | ICD-10-CM | POA: Diagnosis not present

## 2020-12-10 DIAGNOSIS — K5900 Constipation, unspecified: Secondary | ICD-10-CM

## 2020-12-10 DIAGNOSIS — R5383 Other fatigue: Secondary | ICD-10-CM | POA: Diagnosis not present

## 2020-12-10 NOTE — Progress Notes (Signed)
Subjective:    Patient ID: Joanna Banks, female    DOB: September 07, 1986, 35 y.o.   MRN: [id number]  HPI  Pt in for report of some constipation. She states has bm about every 3-5 days on average per pt.   Pt states usually has to use colace stool softener and otc laxative. Sometimes has to do that for 2 consecutive days.  Pt exercises about 3 days a week for about 45 minutes. Drinks about 100 oz of water a day. Does eat fruits and vegetables. Pt admits before a year ago did eat poorly but last year eating healthy. Progressive worse constipation over past 9 years. When younger would have bm every 3 days.  Pt also states recently her hb was 10.1 when she donated blood. Pt states used to see Dr. Ward Chatters.  Hx of anemia in past.   Pt half sister recently had colonoscopy. She had one polyp. Sister 10 yo.     Review of Systems  Constitutional: Negative for chills, fatigue and fever.       Mild fatigue.   Respiratory: Negative for cough, chest tightness, shortness of breath and wheezing.   Cardiovascular: Negative for chest pain and palpitations.  Gastrointestinal: Positive for constipation. Negative for abdominal pain, anal bleeding, diarrhea, nausea and vomiting.  Genitourinary: Negative for dysuria and flank pain.  Musculoskeletal: Negative for back pain, joint swelling and myalgias.  Skin: Negative for rash.  Neurological: Negative for dizziness, seizures, light-headedness and headaches.  Hematological: Negative for adenopathy. Does not bruise/bleed easily.  Psychiatric/Behavioral: Negative for behavioral problems, confusion and suicidal ideas. The patient is not nervous/anxious and is not hyperactive.     Past Medical History:  Diagnosis Date  . PCOS (polycystic ovarian syndrome)      Social History   Socioeconomic History  . Marital status: Married    Spouse name: Not on file  . Number of children: Not on file  . Years of education: Not on file  . Highest education level:  Not on file  Occupational History  . Not on file  Tobacco Use  . Smoking status: Former Smoker    Types: Cigarettes  . Smokeless tobacco: Never Used  Substance and Sexual Activity  . Alcohol use: No  . Drug use: No  . Sexual activity: Yes    Birth control/protection: Condom  Other Topics Concern  . Not on file  Social History Narrative  . Not on file   Social Determinants of Health   Financial Resource Strain: Not on file  Food Insecurity: Not on file  Transportation Needs: Not on file  Physical Activity: Not on file  Stress: Not on file  Social Connections: Not on file  Intimate Partner Violence: Not on file    Past Surgical History:  Procedure Laterality Date  . CESAREAN SECTION    . CHOLECYSTECTOMY      Family History  Problem Relation Age of Onset  . Breast cancer Maternal Aunt   . Lung cancer Maternal Grandfather   . Lung cancer Paternal Grandfather     Allergies  Allergen Reactions  . Sulfa Antibiotics Hives    Current Outpatient Medications on File Prior to Visit  Medication Sig Dispense Refill  . escitalopram (LEXAPRO) 20 MG tablet TAKE 1 TABLET BY MOUTH AT BEDTIME (Patient not taking: Reported on 10/10/2020) 90 tablet 5  . metFORMIN (GLUCOPHAGE) 500 MG tablet Take 1 tablet (500 mg total) by mouth 2 (two) times daily with a meal. 180 tablet 2  .  nitrofurantoin, macrocrystal-monohydrate, (MACROBID) 100 MG capsule Take one cap PO Q12hr with food. (Patient not taking: Reported on 10/10/2020) 14 capsule 0   No current facility-administered medications on file prior to visit.    BP 124/73   Pulse 87   Resp 18   Ht 5\' 4"  (1.626 m)   Wt 221 lb 9.6 oz (100.5 kg)   LMP 11/14/2020   SpO2 99%   BMI 38.04 kg/m       Objective:   Physical Exam  General Mental Status- Alert. General Appearance- Not in acute distress.   Skin General: Color- Normal Color. Moisture- Normal Moisture.  Neck Carotid Arteries- Normal color. Moisture- Normal Moisture. No  carotid bruits. No JVD.  Chest and Lung Exam Auscultation: Breath Sounds:-Normal.  Cardiovascular Auscultation:Rythm- Regular. Murmurs & Other Heart Sounds:Auscultation of the heart reveals- No Murmurs.  Abdomen Inspection:-Inspeection Normal. Palpation/Percussion:Note:No mass. Palpation and Percussion of the abdomen reveal- Non Tender, Non Distended + BS, no rebound or guarding.   Neurologic Cranial Nerve exam:- CN III-XII intact(No nystagmus), symmetric smile. Normal/Intact Strength:- 5/5 equal and symmetric strength both upper and lower extremities.      Assessment & Plan:  History of chronic constipation worse over the past 9 years or so.  This is despite very healthy diet over the last year, exercise, keeping well-hydrated and over-the-counter medications.  Also recent half-sister with a polyp.  Based on failure with conservative measures we will go ahead and refer you to GI MD.  We will see if they think possible early colonoscopy indicated?  Pending referral to gastroenterologist recommend using either MiraLAX or Dulcolax every third day if needed.  Some mild stage abdomen tenderness on exam today and history of anemia on recent labs as well as in the past.  Will repeat CBC today along with CMP and lipase.  Ordered check stool occult for blood as well.  Some mild fatigue at this could be associated with lifestyle and anemia.  Since drawing labs today we will go ahead and add B12, B1, iron panel, vitamin D and thyroid studies.  Follow-up date to be determined based on lab review as well as how fast we can get you in with gastroenterologist.  11/16/2020, PA-C

## 2020-12-10 NOTE — Addendum Note (Signed)
Addended by: Rosita Kea on: 12/10/2020 04:33 PM   Modules accepted: Orders

## 2020-12-10 NOTE — Patient Instructions (Addendum)
History of chronic constipation worse over the past 9 years or so.  This is despite very healthy diet over the last year, exercise, keeping well-hydrated and over-the-counter medications.  Also recent half-sister with a polyp.  Based on failure with conservative measures we will go ahead and refer you to GI MD.  We will see if they think possible early colonoscopy indicated?  Pending referral to gastroenterologist recommend using either MiraLAX or Dulcolax every third day if needed.  Some mild stage abdomen tenderness on exam today and history of anemia on recent labs as well as in the past.  Will repeat CBC today along with CMP and lipase.  Ordered check stool occult for blood as well.  Some mild fatigue at this could be associated with lifestyle and anemia.  Since drawing labs today we will go ahead and add B12, B1, iron panel, vitamin D and thyroid studies.  Follow-up date to be determined based on lab review as well as how fast we can get you in with gastroenterologist.

## 2020-12-10 NOTE — Addendum Note (Signed)
Addended by: Rosita Kea on: 12/10/2020 04:35 PM   Modules accepted: Orders

## 2020-12-11 ENCOUNTER — Encounter: Payer: Self-pay | Admitting: Gastroenterology

## 2020-12-11 LAB — COMPREHENSIVE METABOLIC PANEL
ALT: 10 U/L (ref 0–35)
AST: 13 U/L (ref 0–37)
Albumin: 4.3 g/dL (ref 3.5–5.2)
Alkaline Phosphatase: 58 U/L (ref 39–117)
BUN: 11 mg/dL (ref 6–23)
CO2: 25 mEq/L (ref 19–32)
Calcium: 9.4 mg/dL (ref 8.4–10.5)
Chloride: 103 mEq/L (ref 96–112)
Creatinine, Ser: 0.76 mg/dL (ref 0.40–1.20)
GFR: 101.73 mL/min (ref >60.00)
Glucose, Bld: 83 mg/dL (ref 70–99)
Potassium: 3.9 mEq/L (ref 3.5–5.1)
Sodium: 136 mEq/L (ref 135–145)
Total Bilirubin: 0.3 mg/dL (ref 0.2–1.2)
Total Protein: 7.6 g/dL (ref 6.0–8.3)

## 2020-12-11 LAB — CBC WITH DIFFERENTIAL/PLATELET
Basophils Absolute: 0.1 10*3/uL (ref 0.0–0.1)
Basophils Relative: 1.1 % (ref 0.0–3.0)
Eosinophils Absolute: 0.1 10*3/uL (ref 0.0–0.7)
Eosinophils Relative: 0.6 % (ref 0.0–5.0)
HCT: 30.6 % — ABNORMAL LOW (ref 36.0–46.0)
Hemoglobin: 9.5 g/dL — ABNORMAL LOW (ref 12.0–15.0)
Lymphocytes Relative: 26.3 % (ref 12.0–46.0)
Lymphs Abs: 3 10*3/uL (ref 0.7–4.0)
MCHC: 31.1 g/dL (ref 30.0–36.0)
MCV: 66.7 fl — ABNORMAL LOW (ref 78.0–100.0)
Monocytes Absolute: 0.7 10*3/uL (ref 0.1–1.0)
Monocytes Relative: 6.1 % (ref 3.0–12.0)
Neutro Abs: 7.6 10*3/uL (ref 1.4–7.7)
Neutrophils Relative %: 65.9 % (ref 43.0–77.0)
Platelets: 492 10*3/uL — ABNORMAL HIGH (ref 150.0–400.0)
RBC: 4.59 Mil/uL (ref 3.87–5.11)
RDW: 17.9 % — ABNORMAL HIGH (ref 11.5–15.5)
WBC: 11.5 10*3/uL — ABNORMAL HIGH (ref 4.0–10.5)

## 2020-12-11 LAB — IBC + FERRITIN
Ferritin: 2.3 ng/mL — ABNORMAL LOW (ref 10.0–291.0)
Iron: 15 ug/dL — ABNORMAL LOW (ref 42–145)
Saturation Ratios: 3 % — ABNORMAL LOW (ref 20.0–50.0)
Transferrin: 359 mg/dL (ref 212.0–360.0)

## 2020-12-11 LAB — VITAMIN B12: Vitamin B-12: 1092 pg/mL — ABNORMAL HIGH (ref 211–911)

## 2020-12-11 LAB — TSH: TSH: 1.04 u[IU]/mL (ref 0.35–4.50)

## 2020-12-11 LAB — LIPASE: Lipase: 31 U/L (ref 11.0–59.0)

## 2020-12-14 LAB — VITAMIN B1: Vitamin B1 (Thiamine): 15 nmol/L (ref 8–30)

## 2020-12-16 ENCOUNTER — Encounter: Payer: Self-pay | Admitting: Medical

## 2020-12-16 ENCOUNTER — Ambulatory Visit: Payer: BC Managed Care – PPO | Admitting: Medical

## 2020-12-26 ENCOUNTER — Telehealth: Payer: Self-pay | Admitting: Medical

## 2020-12-26 MED ORDER — FERROUS SULFATE 324 (65 FE) MG PO TBEC: DELAYED_RELEASE_TABLET | ORAL | 1 refills | Status: DC

## 2020-12-26 NOTE — Telephone Encounter (Signed)
Rx iron sent to pharmacy.

## 2020-12-30 ENCOUNTER — Other Ambulatory Visit (INDEPENDENT_AMBULATORY_CARE_PROVIDER_SITE_OTHER): Payer: BC Managed Care – PPO

## 2020-12-30 ENCOUNTER — Other Ambulatory Visit: Payer: Self-pay | Admitting: *Deleted

## 2020-12-30 ENCOUNTER — Telehealth: Payer: Self-pay

## 2020-12-30 ENCOUNTER — Encounter: Payer: Self-pay | Admitting: Medical

## 2020-12-30 DIAGNOSIS — D649 Anemia, unspecified: Secondary | ICD-10-CM | POA: Diagnosis not present

## 2020-12-30 LAB — FECAL OCCULT BLOOD, IMMUNOCHEMICAL: Fecal Occult Bld: POSITIVE — AB

## 2020-12-30 NOTE — Telephone Encounter (Signed)
Positive Ifob  Caller: Archie Patten Receiver: Rosita Kea, RMA at 9:38 am  Date and time: 12/30/2020 at 9:42 am

## 2020-12-30 NOTE — Progress Notes (Signed)
Received call from [facility] lab stating they received IFOB on pt but no order is in Epic.  Future order placed.

## 2020-12-30 NOTE — Telephone Encounter (Signed)
Noted ifob +  and reviewed lab result.

## 2021-01-03 ENCOUNTER — Other Ambulatory Visit: Payer: Self-pay | Admitting: Medical

## 2021-01-07 ENCOUNTER — Other Ambulatory Visit: Payer: Self-pay

## 2021-01-07 ENCOUNTER — Ambulatory Visit (INDEPENDENT_AMBULATORY_CARE_PROVIDER_SITE_OTHER): Payer: BC Managed Care – PPO | Admitting: Gastroenterology

## 2021-01-07 ENCOUNTER — Encounter: Payer: Self-pay | Admitting: Gastroenterology

## 2021-01-07 VITALS — BP 128/88 | HR 96 | Ht 64.0 in | Wt 217.0 lb

## 2021-01-07 DIAGNOSIS — D509 Iron deficiency anemia, unspecified: Secondary | ICD-10-CM

## 2021-01-07 DIAGNOSIS — Z8742 Personal history of other diseases of the female genital tract: Secondary | ICD-10-CM

## 2021-01-07 DIAGNOSIS — R194 Change in bowel habit: Secondary | ICD-10-CM | POA: Diagnosis not present

## 2021-01-07 DIAGNOSIS — R195 Other fecal abnormalities: Secondary | ICD-10-CM

## 2021-01-07 DIAGNOSIS — K5900 Constipation, unspecified: Secondary | ICD-10-CM

## 2021-01-07 DIAGNOSIS — R140 Abdominal distension (gaseous): Secondary | ICD-10-CM | POA: Diagnosis not present

## 2021-01-07 MED ORDER — CLENPIQ 10-3.5-12 MG-GM -GM/160ML PO SOLN: 1.0000 | ORAL | 0 refills | Status: DC

## 2021-01-07 NOTE — Progress Notes (Signed)
Chief Complaint: Constipation, abdominal bloating, IDA   Referring Provider:     Esperanza Richters, PA-C   HPI:     Joanna Banks is a 35 y.o. female referred to the [facility]astroenterology Clinic for evaluation of constipation, FOBT+ stools, and abdominal bloating.  She reports that 1 BM every 5-6 days, despite using OTC stools softeners and laxatives.  Maintains good fluid intake.  Sxs present for the last year or so. No preceding changes in meds, diet, etc. Has intentionally lost 50# in last 6+ months or so with diet/exercise, but still with ongoing constipation. Abdominal bloating along same timeline, which improves with BM.   Had bleeding hemorrhoids with 3rd child, but no hemorrhoidal sxs, BRBPR since then (04/2018). Has had heavy periods, which are reduced since restarting Metformin (PCOS) early 2020. Periods are now regular, but heavy x2-3 days. Does have hx of Vit D def in the past.   No n/v/f/c. +night sweats, soaking through t-shirts over last 3 months or so.   Additionally, she has history of IDA diagnosed in 11/2019: -H/H 11.2/35.4 with MCV/RDW 75.7/15.8 -Ferritin 3.3, iron 22, sat 4.7% -Started oral iron x1 month for suspected IDA 2/2 menorrhagia  -04/2020: H/H 10.1/33.4  Recent labs in 11/2020 notable for the following: -FOBT positive -H/H 9.5/30.6, MCV/RDW 66/18 -Ferritin 2.3, iron 15, sat 3% -Normal CMP, B1, B12 -Again started on oral iron this month  Baseline H/H 09/2017 was 13.9/43.4 with MCV 83.  All have down-trended since then, with Hgb hovering ~11 in 2019-2021.  No prior EGD/colonoscopy.   No known family history of CRC, GI malignancy, liver disease, pancreatic disease, or IBD.    Past Medical History:  Diagnosis Date  . Anemia   . Anxiety   . History of gallstones   . Obesity   . PCOS (polycystic ovarian syndrome)   . UTI (urinary tract infection)      Past Surgical History:  Procedure Laterality Date  . CESAREAN SECTION  2012  .  CHOLECYSTECTOMY  11/2019   [facility]   Family History  Problem Relation Age of Onset  . Breast cancer Maternal Aunt   . Lung cancer Maternal Grandfather   . Lung cancer Paternal Grandfather   . Diabetes Mother   . Diabetes Father   . Heart disease Father   . Colon polyps Half-Sister   . Colon cancer Neg Hx   . Esophageal cancer Neg Hx    Social History   Tobacco Use  . Smoking status: Former Smoker    Types: Cigarettes  . Smokeless tobacco: Never Used  Vaping Use  . Vaping Use: Never used  Substance Use Topics  . Alcohol use: No  . Drug use: No   Current Outpatient Medications  Medication Sig Dispense Refill  . ferrous sulfate 324 (65 Fe) MG TBEC 1 tab po bid 30 tablet 1  . metFORMIN (GLUCOPHAGE) 500 MG tablet Take 1 tablet (500 mg total) by mouth 2 (two) times daily with a meal. 180 tablet 2   No current facility-administered medications for this visit.   Allergies  Allergen Reactions  . Sulfa Antibiotics Hives     Review of Systems: All systems reviewed and negative except where noted in HPI.     Physical Exam:    Wt Readings from Last 3 Encounters:  01/07/21 217 lb (98.4 kg)  12/10/20 221 lb 9.6 oz (100.5 kg)  10/10/20 228 lb (103.4 kg)  BP 128/88   Pulse 96   Ht 5\' 4"  (1.626 m)   Wt 217 lb (98.4 kg)   BMI 37.25 kg/m  Constitutional:  Pleasant, in no acute distress. Psychiatric: Normal mood and affect. Behavior is normal. EENT: Pupils normal.  Conjunctivae are normal. No scleral icterus. Neck supple. No cervical LAD. Cardiovascular: Normal rate, regular rhythm. No edema Pulmonary/chest: Effort normal and breath sounds normal. No wheezing, rales or rhonchi. Abdominal: Soft, nondistended, nontender. Bowel sounds active throughout. There are no masses palpable. No hepatomegaly. Neurological: Alert and oriented to person place and time. Skin: Skin is warm and dry. No rashes noted.   ASSESSMENT AND PLAN;   1) Iron deficiency  anemia 2) Constipation 3) Change in bowels 4) Abdominal bloating 5) FOBT positive stool  While her history of PCOS and menorrhagia could be the underlying culprit for her IDA, her GI symptomatology and FOBT positive stools are certainly concerning for GI etiology.  Plan as follows: -EGD and colonoscopy to evaluate for mucosal/luminal pathology -Plan for 2-day bowel preparation -Stop p.o. iron due to GI side effects -Referral for IV iron -If EGD/colonoscopy unrevealing, plan for VCE for small bowel interrogation  6) History of PCOS -Continue Metformin -Follow-up with GYN  The indications, risks, and benefits of EGD and colonoscopy were explained to the patient in detail. Risks include but are not limited to bleeding, perforation, adverse reaction to medications, and cardiopulmonary compromise. Sequelae include but are not limited to the possibility of surgery, hospitalization, and mortality. The patient verbalized understanding and wished to proceed. All questions answered, referred to scheduler and bowel prep ordered. Further recommendations pending results of the exam.    , DO, FACG  01/07/2021, 3:52 PM   [place], 01/09/2021, PA-C

## 2021-01-07 NOTE — Patient Instructions (Addendum)
If you are age 35 or older, your body mass index should be between 23-30. Your Body mass index is 37.25 kg/m. If this is out of the aforementioned range listed, please consider follow up with your Primary Care Provider.  If you are age 49 or younger, your body mass index should be between 19-25. Your Body mass index is 37.25 kg/m. If this is out of the aformentioned range listed, please consider follow up with your Primary Care Provider.   Please go to the 2nd floor of this building today and schedule your labwork, [facility], [address].   We have sent the following medications to your pharmacy for you to pick up at your convenience: Clenpiq  Stop taking your oral iron You will hear from Hematology regarding Iron transfusion.  Due to recent changes in healthcare laws, you may see the results of your imaging and laboratory studies on MyChart before your provider has had a chance to review them.  We understand that in some cases there may be results that are confusing or concerning to you. Not all laboratory results come back in the same time frame and the provider may be waiting for multiple results in order to interpret others.  Please give [place] 48 hours in order for your provider to thoroughly review all the results before contacting the office for clarification of your results.   Thank you for choosing me and [facility].  Vito Cirigliano, D.O.

## 2021-01-08 ENCOUNTER — Other Ambulatory Visit (INDEPENDENT_AMBULATORY_CARE_PROVIDER_SITE_OTHER): Payer: BC Managed Care – PPO

## 2021-01-08 ENCOUNTER — Telehealth: Payer: Self-pay | Admitting: *Deleted

## 2021-01-08 DIAGNOSIS — D509 Iron deficiency anemia, unspecified: Secondary | ICD-10-CM | POA: Diagnosis not present

## 2021-01-08 DIAGNOSIS — R140 Abdominal distension (gaseous): Secondary | ICD-10-CM

## 2021-01-08 DIAGNOSIS — K5900 Constipation, unspecified: Secondary | ICD-10-CM

## 2021-01-08 NOTE — Addendum Note (Signed)
Addended by: Latricia Heft A on: 01/08/2021 03:52 PM   Modules accepted: Orders

## 2021-01-08 NOTE — Telephone Encounter (Signed)
Per referral 01/08/21 Dr. Carloyn Manner - called and gave patient upcoming appointments

## 2021-01-09 LAB — IGA: Immunoglobulin A: 138 mg/dL (ref 47–310)

## 2021-01-09 LAB — TISSUE TRANSGLUTAMINASE, IGA: (tTG) Ab, IgA: <1 U/mL

## 2021-01-10 ENCOUNTER — Ambulatory Visit: Payer: BC Managed Care – PPO | Admitting: Medical

## 2021-01-10 ENCOUNTER — Other Ambulatory Visit: Payer: Self-pay

## 2021-01-10 ENCOUNTER — Encounter: Payer: Self-pay | Admitting: Medical

## 2021-01-10 VITALS — BP 123/73 | HR 80 | Temp 97.7°F | Resp 18 | Ht 64.0 in | Wt 218.0 lb

## 2021-01-10 DIAGNOSIS — R5383 Other fatigue: Secondary | ICD-10-CM

## 2021-01-10 DIAGNOSIS — D649 Anemia, unspecified: Secondary | ICD-10-CM | POA: Diagnosis not present

## 2021-01-10 LAB — CBC WITH DIFFERENTIAL/PLATELET
Basophils Absolute: 0.1 10*3/uL (ref 0.0–0.1)
Basophils Relative: 0.6 % (ref 0.0–3.0)
Eosinophils Absolute: 0 10*3/uL (ref 0.0–0.7)
Eosinophils Relative: 0.5 % (ref 0.0–5.0)
HCT: 31.3 % — ABNORMAL LOW (ref 36.0–46.0)
Hemoglobin: 9.8 g/dL — ABNORMAL LOW (ref 12.0–15.0)
Lymphocytes Relative: 25.9 % (ref 12.0–46.0)
Lymphs Abs: 2.3 10*3/uL (ref 0.7–4.0)
MCHC: 31.4 g/dL (ref 30.0–36.0)
MCV: 65.6 fl — ABNORMAL LOW (ref 78.0–100.0)
Monocytes Absolute: 0.5 10*3/uL (ref 0.1–1.0)
Monocytes Relative: 5.8 % (ref 3.0–12.0)
Neutro Abs: 5.9 10*3/uL (ref 1.4–7.7)
Neutrophils Relative %: 67.2 % (ref 43.0–77.0)
Platelets: 452 10*3/uL — ABNORMAL HIGH (ref 150.0–400.0)
RBC: 4.78 Mil/uL (ref 3.87–5.11)
RDW: 18.6 % — ABNORMAL HIGH (ref 11.5–15.5)
WBC: 8.7 10*3/uL (ref 4.0–10.5)

## 2021-01-10 LAB — IRON: Iron: 14 ug/dL — ABNORMAL LOW (ref 42–145)

## 2021-01-10 NOTE — Progress Notes (Signed)
Subjective:    Patient ID: JASDEEP DEJARNETT, female    DOB: 22-Jul-1986, 35 y.o.   MRN: [id number]  HPI  Pt in for follow up.  Pt has hx of anemia and just recently saw GI.   "1)Iron deficiency anemia 2) Constipation 3) Change in bowels 4) Abdominal bloating 5) FOBT positive stool  While her history of PCOS and menorrhagia could be the underlying culprit for her IDA, her GI symptomatology and FOBT positive stools are certainly concerning for GI etiology.  Plan as follows: EGD and colonoscopy to evaluate for mucosal/luminal pathology -Plan for 2-day bowel preparation -Stop p.o. iron due to GI side effects -Referral for IV iron -If EGD/colonoscopy unrevealing, plan for VCE for small bowel interrogation"   Pt states she is very tired/feels exhausted. No energy and falling asleep at 8 pm every night. 35 yo, 35 yo and 35 yo.  Pt has seen gyn summer/fall. Pt states first day of her cycle is very heavy for 1st day then tapers off and last for about 4-5 days.  Pt colonoscopy is in May.     Review of Systems  Constitutional: Positive for fatigue. Negative for chills and fever.  HENT: Negative for congestion and ear discharge.   Respiratory: Negative for cough, chest tightness, shortness of breath and wheezing.   Cardiovascular: Negative for chest pain and palpitations.  Gastrointestinal: Negative for abdominal pain, constipation, nausea and vomiting.  Genitourinary: Negative for dysuria.  Musculoskeletal: Negative for back pain and myalgias.  Skin: Negative for rash.  Neurological: Negative for dizziness, syncope, weakness, numbness and headaches.  Hematological: Negative for adenopathy. Does not bruise/bleed easily.  Psychiatric/Behavioral: Negative for behavioral problems, decreased concentration, dysphoric mood and sleep disturbance. The patient is not nervous/anxious.      Past Medical History:  Diagnosis Date  . Anemia   . Anxiety   . History of gallstones   . Obesity   .  PCOS (polycystic ovarian syndrome)   . UTI (urinary tract infection)      Social History   Socioeconomic History  . Marital status: Married    Spouse name: Not on file  . Number of children: Not on file  . Years of education: Not on file  . Highest education level: Not on file  Occupational History  . Occupation: Pharmacist, hospital  Tobacco Use  . Smoking status: Former Smoker    Types: Cigarettes  . Smokeless tobacco: Never Used  Vaping Use  . Vaping Use: Never used  Substance and Sexual Activity  . Alcohol use: No  . Drug use: No  . Sexual activity: Yes    Birth control/protection: Condom  Other Topics Concern  . Not on file  Social History Narrative  . Not on file   Social Determinants of Health   Financial Resource Strain: Not on file  Food Insecurity: Not on file  Transportation Needs: Not on file  Physical Activity: Not on file  Stress: Not on file  Social Connections: Not on file  Intimate Partner Violence: Not on file    Past Surgical History:  Procedure Laterality Date  . CESAREAN SECTION  2012  . CHOLECYSTECTOMY  11/2019   [facility]    Family History  Problem Relation Age of Onset  . Breast cancer Maternal Aunt   . Lung cancer Maternal Grandfather   . Lung cancer Paternal Grandfather   . Diabetes Mother   . Diabetes Father   . Heart disease Father   . Colon polyps Half-Sister   .  Colon cancer Neg Hx   . Esophageal cancer Neg Hx     Allergies  Allergen Reactions  . Sulfa Antibiotics Hives    Current Outpatient Medications on File Prior to Visit  Medication Sig Dispense Refill  . metFORMIN (GLUCOPHAGE) 500 MG tablet Take 1 tablet (500 mg total) by mouth 2 (two) times daily with a meal. 180 tablet 2  . Sod Picosulfate-Mag Ox-Cit Acd (CLENPIQ) 10-3.5-12 MG-GM -GM/160ML SOLN Take 1 kit by mouth as directed. 320 mL 0  . ferrous sulfate 324 (65 Fe) MG TBEC 1 tab po bid (Patient not taking: Reported on 01/10/2021) 30 tablet 1   No current  facility-administered medications on file prior to visit.    BP 123/73   Pulse 80   Temp 97.7 F (36.5 C)   Resp 18   Ht 5' 4"  (1.626 m)   Wt 218 lb (98.9 kg)   LMP 12/12/2020   SpO2 100%   BMI 37.42 kg/m       Objective:   Physical Exam  General Mental Status- Alert. General Appearance- Not in acute distress.   Skin General: Color- Normal Color. Moisture- Normal Moisture.  Neck Carotid Arteries- Normal color. Moisture- Normal Moisture. No carotid bruits. No JVD.  Chest and Lung Exam Auscultation: Breath Sounds:-Normal.  Cardiovascular Auscultation:Rythm- Regular. Murmurs & Other Heart Sounds:Auscultation of the heart reveals- No Murmurs.  Abdomen Inspection:-Inspeection Normal. Palpation/Percussion:Note:No mass. Palpation and Percussion of the abdomen reveal- Non Tender, Non Distended + BS, no rebound or guarding.  Neurologic Cranial Nerve exam:- CN III-XII intact(No nystagmus), symmetric smile. Strength:- 5/5 equal and symmetric strength both upper and lower extremities.      Assessment & Plan:  For anemia and fatigue will repeat your iron and anemia level today. Make sure reasonably stable. Follow thru with iron infusion.   Recommend calling gyn office and pcos and heavy menses may be one of underlying reasons for anemia. [place] may be beneficial to evaluate if any interval change such as fibroids?  Follow colonoscopy scheduled in May.  Follow up date to be determined after lab review.  Mackie Pai, PA-C

## 2021-01-10 NOTE — Patient Instructions (Signed)
For anemia and fatigue will repeat your iron and anemia level today. Make sure reasonably stable. Follow thru with iron infusion.   Recommend calling gyn office and pcos and heavy menses may be one of underlying reasons for anemia. [place] may be beneficial to evaluate if any interval change such as fibroids?  Follow colonoscopy scheduled in May.  Follow up date to be determined after lab review.

## 2021-01-14 ENCOUNTER — Other Ambulatory Visit: Payer: Self-pay | Admitting: Family

## 2021-01-14 DIAGNOSIS — D649 Anemia, unspecified: Secondary | ICD-10-CM

## 2021-01-15 ENCOUNTER — Encounter: Payer: Self-pay | Admitting: Family

## 2021-01-15 ENCOUNTER — Inpatient Hospital Stay: Payer: BC Managed Care – PPO | Attending: Family | Admitting: Family

## 2021-01-15 ENCOUNTER — Ambulatory Visit: Payer: BC Managed Care – PPO | Admitting: Family

## 2021-01-15 ENCOUNTER — Other Ambulatory Visit: Payer: BC Managed Care – PPO

## 2021-01-15 ENCOUNTER — Other Ambulatory Visit: Payer: Self-pay

## 2021-01-15 ENCOUNTER — Inpatient Hospital Stay: Payer: BC Managed Care – PPO | Attending: Hematology & Oncology

## 2021-01-15 DIAGNOSIS — Z8249 Family history of ischemic heart disease and other diseases of the circulatory system: Secondary | ICD-10-CM | POA: Diagnosis not present

## 2021-01-15 DIAGNOSIS — D509 Iron deficiency anemia, unspecified: Secondary | ICD-10-CM | POA: Insufficient documentation

## 2021-01-15 DIAGNOSIS — E282 Polycystic ovarian syndrome: Secondary | ICD-10-CM | POA: Diagnosis not present

## 2021-01-15 DIAGNOSIS — Z7984 Long term (current) use of oral hypoglycemic drugs: Secondary | ICD-10-CM | POA: Diagnosis not present

## 2021-01-15 DIAGNOSIS — Z833 Family history of diabetes mellitus: Secondary | ICD-10-CM | POA: Diagnosis not present

## 2021-01-15 DIAGNOSIS — Z803 Family history of malignant neoplasm of breast: Secondary | ICD-10-CM | POA: Insufficient documentation

## 2021-01-15 DIAGNOSIS — Z79899 Other long term (current) drug therapy: Secondary | ICD-10-CM | POA: Diagnosis not present

## 2021-01-15 DIAGNOSIS — Z87891 Personal history of nicotine dependence: Secondary | ICD-10-CM | POA: Insufficient documentation

## 2021-01-15 DIAGNOSIS — Z801 Family history of malignant neoplasm of trachea, bronchus and lung: Secondary | ICD-10-CM | POA: Insufficient documentation

## 2021-01-15 DIAGNOSIS — D500 Iron deficiency anemia secondary to blood loss (chronic): Secondary | ICD-10-CM

## 2021-01-15 DIAGNOSIS — D649 Anemia, unspecified: Secondary | ICD-10-CM

## 2021-01-15 LAB — CBC WITH DIFFERENTIAL (CANCER CENTER ONLY)
Abs Immature Granulocytes: 0.04 10*3/uL (ref 0.00–0.07)
Basophils Absolute: 0 10*3/uL (ref 0.0–0.1)
Basophils Relative: 0 %
Eosinophils Absolute: 0.1 10*3/uL (ref 0.0–0.5)
Eosinophils Relative: 1 %
HCT: 34.3 % — ABNORMAL LOW (ref 36.0–46.0)
Hemoglobin: 10.5 g/dL — ABNORMAL LOW (ref 12.0–15.0)
Immature Granulocytes: 0 %
Lymphocytes Relative: 25 %
Lymphs Abs: 2.6 10*3/uL (ref 0.7–4.0)
MCH: 20.7 pg — ABNORMAL LOW (ref 26.0–34.0)
MCHC: 30.6 g/dL (ref 30.0–36.0)
MCV: 67.5 fL — ABNORMAL LOW (ref 80.0–100.0)
Monocytes Absolute: 0.6 10*3/uL (ref 0.1–1.0)
Monocytes Relative: 6 %
Neutro Abs: 6.9 10*3/uL (ref 1.7–7.7)
Neutrophils Relative %: 68 %
Platelet Count: 548 10*3/uL — ABNORMAL HIGH (ref 150–400)
RBC: 5.08 MIL/uL (ref 3.87–5.11)
RDW: 19.6 % — ABNORMAL HIGH (ref 11.5–15.5)
WBC Count: 10.3 10*3/uL (ref 4.0–10.5)
nRBC: 0 % (ref 0.0–0.2)

## 2021-01-15 LAB — CMP (CANCER CENTER ONLY)
ALT: 23 U/L (ref 0–44)
AST: 27 U/L (ref 15–41)
Albumin: 4.6 g/dL (ref 3.5–5.0)
Alkaline Phosphatase: 77 U/L (ref 38–126)
Anion gap: 10 (ref 5–15)
BUN: 7 mg/dL (ref 6–20)
CO2: 26 mmol/L (ref 22–32)
Calcium: 9.3 mg/dL (ref 8.9–10.3)
Chloride: 100 mmol/L (ref 98–111)
Creatinine: 0.75 mg/dL (ref 0.44–1.00)
GFR, Estimated: >60 mL/min (ref >60)
Glucose, Bld: 91 mg/dL (ref 70–99)
Potassium: 3.4 mmol/L — ABNORMAL LOW (ref 3.5–5.1)
Sodium: 136 mmol/L (ref 135–145)
Total Bilirubin: 0.4 mg/dL (ref 0.3–1.2)
Total Protein: 7.8 g/dL (ref 6.5–8.1)

## 2021-01-15 LAB — LACTATE DEHYDROGENASE: LDH: 126 U/L (ref 98–192)

## 2021-01-15 LAB — RETICULOCYTES
Immature Retic Fract: 14.4 % (ref 2.3–15.9)
RBC.: 5 MIL/uL (ref 3.87–5.11)
Retic Count, Absolute: 48 10*3/uL (ref 19.0–186.0)
Retic Ct Pct: 1 % (ref 0.4–3.1)

## 2021-01-15 LAB — SAVE SMEAR(SSMR), FOR PROVIDER SLIDE REVIEW

## 2021-01-15 NOTE — Progress Notes (Signed)
Hematology/Oncology Consultation   Name: Joanna Banks      MRN: [id number]    Location: Room/bed info not found  Date: 01/15/2021 Time:2:50 PM   REFERRING PHYSICIAN: Vito Cirigliano, DO  REASON FOR CONSULT: Iron deficiency anemia    DIAGNOSIS: Iron deficiency anemia   HISTORY OF PRESENT ILLNESS: Joanna Banks is a very pleasant 35 yo caucasian female with history of iron deficiency anemia. She states that her Hgb and iron have been low since her first pregnancy.  She is on Metformin for PCOS.  She has tried taking oral iron supplements in the past with no improvement and constipation.  She has never received IV iron in the past. She is symptomatic with fatigue, weakness, dizziness, loss of appetite, ice cravings and brain fog.  Recent iron saturation was 3% and ferritin 2. Hgb today is 10.5, MCV 67.5, platelets 548.  Recent fecal occult testing was positive. She has an endoscopy and colonoscopy scheduled for May 11th to further evaluate. She does have intermittent left sided abdominal discomfort.  She states that her cycle is regular and heavy the first 1-2 days and lasts about 4 days. No abnormal bruising or petechiae.  No known family history of anemia.  She has 3 sons and had 1 miscarriage with her first pregnancy within the first 8 weeks.  She states that she has had a cholecystectomy and C-section without any complications.  No personal history of cancer. Familial cancer history includes both maternal grandmother and aunt with breast and paternal grandfather with lung (smoker).  No personal history of diabetes or thyroid disease.  No fever, chills, n/v, cough, rash, SOB, chest pain, palpitations or changes in bowel or bladder habits.  No swelling, tenderness, numbness or tingling in her extremities.  No falls or syncope to report.  She feels that she is staying well hydrated. Her weight is stable at 213 lbs.  No smoking, ETOH or recreational drug use.  She stays quite busy with her  family and working as a [employer].   ROS: All other 10 point review of systems is negative.   PAST MEDICAL HISTORY:   Past Medical History:  Diagnosis Date  . Anemia   . Anxiety   . History of gallstones   . Obesity   . PCOS (polycystic ovarian syndrome)   . UTI (urinary tract infection)     ALLERGIES: Allergies  Allergen Reactions  . Sulfa Antibiotics Hives      MEDICATIONS:  Current Outpatient Medications on File Prior to Visit  Medication Sig Dispense Refill  . ferrous sulfate 324 (65 Fe) MG TBEC 1 tab po bid (Patient not taking: Reported on 01/10/2021) 30 tablet 1  . metFORMIN (GLUCOPHAGE) 500 MG tablet Take 1 tablet (500 mg total) by mouth 2 (two) times daily with a meal. 180 tablet 2  . Sod Picosulfate-Mag Ox-Cit Acd (CLENPIQ) 10-3.5-12 MG-GM -GM/160ML SOLN Take 1 kit by mouth as directed. 320 mL 0   No current facility-administered medications on file prior to visit.     PAST SURGICAL HISTORY Past Surgical History:  Procedure Laterality Date  . CESAREAN SECTION  2012  . CHOLECYSTECTOMY  11/2019   [facility]    FAMILY HISTORY: Family History  Problem Relation Age of Onset  . Breast cancer Maternal Aunt   . Lung cancer Maternal Grandfather   . Lung cancer Paternal Grandfather   . Diabetes Mother   . Diabetes Father   . Heart disease Father   . Colon  polyps Half-Sister   . Colon cancer Neg Hx   . Esophageal cancer Neg Hx     SOCIAL HISTORY:  reports that she has quit smoking. Her smoking use included cigarettes. She has never used smokeless tobacco. She reports that she does not drink alcohol and does not use drugs.  PERFORMANCE STATUS: The patient's performance status is 1 - Symptomatic but completely ambulatory  PHYSICAL EXAM: Most Recent Vital Signs: There were no vitals taken for this visit. There were no vitals taken for this visit.  General Appearance:    Alert, cooperative, no distress, appears stated age  Head:     Normocephalic, without obvious abnormality, atraumatic  Eyes:    PERRL, conjunctiva/corneas clear, EOM's intact, fundi    benign, both eyes        Throat:   Lips, mucosa, and tongue normal; teeth and gums normal  Neck:   Supple, symmetrical, trachea midline, no adenopathy;    thyroid:  no enlargement/tenderness/nodules; no carotid   bruit or JVD  Back:     Symmetric, no curvature, ROM normal, no CVA tenderness  Lungs:     Clear to auscultation bilaterally, respirations unlabored  Chest Wall:    No tenderness or deformity   Heart:    Regular rate and rhythm, S1 and S2 normal, no murmur, rub   or gallop     Abdomen:     Soft, non-tender, bowel sounds active all four quadrants,    no masses, no organomegaly        Extremities:   Extremities normal, atraumatic, no cyanosis or edema  Pulses:   2+ and symmetric all extremities  Skin:   Skin color, texture, turgor normal, no rashes or lesions  Lymph nodes:   Cervical, supraclavicular, and axillary nodes normal  Neurologic:   CNII-XII intact, normal strength, sensation and reflexes    throughout    LABORATORY DATA:  No results found for this or any previous visit (from the past 48 hour(s)).    RADIOGRAPHY: No results found.     PATHOLOGY: None  ASSESSMENT/PLAN: Joanna Banks is a very pleasant 35 yo caucasian female with history of iron deficiency anemia due to heavy cycles and GI blood loss.  She is scheduled for endoscopy and colonoscopy on 02/26/21 to evaluate for source of GI bleed. We will get her set up for 2 doses of IV iron starting this week.  We will follow-up with her in another 6 weeks to recheck labs and see how she is feeling.    All questions were answered and she is in agreement with the plan. She can contact our office with any questions or concerns. We can certainly see the patient sooner if needed.    Laverna Peace, NP

## 2021-01-16 LAB — ERYTHROPOIETIN: Erythropoietin: 19.4 m[IU]/mL — ABNORMAL HIGH (ref 2.6–18.5)

## 2021-01-17 ENCOUNTER — Other Ambulatory Visit: Payer: Self-pay

## 2021-01-17 ENCOUNTER — Inpatient Hospital Stay: Payer: BC Managed Care – PPO | Attending: Family

## 2021-01-17 ENCOUNTER — Ambulatory Visit: Payer: BC Managed Care – PPO

## 2021-01-17 VITALS — BP 114/70 | HR 70 | Temp 98.3°F | Resp 17

## 2021-01-17 DIAGNOSIS — D500 Iron deficiency anemia secondary to blood loss (chronic): Secondary | ICD-10-CM

## 2021-01-17 DIAGNOSIS — D509 Iron deficiency anemia, unspecified: Secondary | ICD-10-CM | POA: Insufficient documentation

## 2021-01-17 MED ORDER — SODIUM CHLORIDE 0.9 % IV SOLN
125.0000 mg | Freq: Once | INTRAVENOUS | Status: AC
  Administered 2021-01-17: 125 mg via INTRAVENOUS
  Filled 2021-01-17: qty 10

## 2021-01-17 MED ORDER — SODIUM CHLORIDE 0.9 % IV SOLN
Freq: Once | INTRAVENOUS | Status: AC
  Filled 2021-01-17: qty 250

## 2021-01-24 ENCOUNTER — Other Ambulatory Visit: Payer: Self-pay

## 2021-01-24 ENCOUNTER — Inpatient Hospital Stay: Payer: BC Managed Care – PPO

## 2021-01-24 VITALS — BP 110/61 | HR 66 | Temp 99.0°F | Resp 16

## 2021-01-24 DIAGNOSIS — D509 Iron deficiency anemia, unspecified: Secondary | ICD-10-CM | POA: Diagnosis not present

## 2021-01-24 DIAGNOSIS — D500 Iron deficiency anemia secondary to blood loss (chronic): Secondary | ICD-10-CM

## 2021-01-24 MED ORDER — SODIUM CHLORIDE 0.9 % IV SOLN
125.0000 mg | Freq: Once | INTRAVENOUS | Status: AC
  Administered 2021-01-24: 125 mg via INTRAVENOUS
  Filled 2021-01-24: qty 10

## 2021-01-24 MED ORDER — SODIUM CHLORIDE 0.9 % IV SOLN
Freq: Once | INTRAVENOUS | Status: AC
  Filled 2021-01-24: qty 250

## 2021-01-24 NOTE — Patient Instructions (Signed)
Sodium Ferric Gluconate Complex injection What is this medicine? SODIUM FERRIC GLUCONATE COMPLEX (SOE dee um FER ik GLOO koe nate KOM pleks) is an iron replacement. It is used with epoetin therapy to treat low iron levels in patients who are receiving hemodialysis. This medicine may be used for other purposes; ask your health care provider or pharmacist if you have questions. COMMON BRAND NAME(S): Ferrlecit, Nulecit What should I tell my health care provider before I take this medicine? They need to know if you have any of the following conditions:  anemia that is not from iron deficiency  high levels of iron in the body  an unusual or allergic reaction to iron, benzyl alcohol, other medicines, foods, dyes, or preservatives  pregnant or are trying to become pregnant  breast-feeding How should I use this medicine? This medicine is for infusion into a vein. It is given by a health care professional in a hospital or clinic setting. Talk to your pediatrician regarding the use of this medicine in children. While this drug may be prescribed for children as young as 6 years old for selected conditions, precautions do apply. Overdosage: If you think you have taken too much of this medicine contact a poison control center or emergency room at once. NOTE: This medicine is only for you. Do not share this medicine with others. What if I miss a dose? It is important not to miss your dose. Call your doctor or health care professional if you are unable to keep an appointment. What may interact with this medicine? Do not take this medicine with any of the following medications:  deferoxamine  dimercaprol  other iron products This medicine may also interact with the following medications:  chloramphenicol  deferasirox  medicine for blood pressure like enalapril This list may not describe all possible interactions. Give your health care provider a list of all the medicines, herbs,  non-prescription drugs, or dietary supplements you use. Also tell them if you smoke, drink alcohol, or use illegal drugs. Some items may interact with your medicine. What should I watch for while using this medicine? Your condition will be monitored carefully while you are receiving this medicine. Visit your doctor for check-ups as directed. What side effects may I notice from receiving this medicine? Side effects that you should report to your doctor or health care professional as soon as possible:  allergic reactions like skin rash, itching or hives, swelling of the face, lips, or tongue  breathing problems  changes in hearing  changes in vision  chills, flushing, or sweating  fast, irregular heartbeat  feeling faint or lightheaded, falls  fever, flu-like symptoms  high or low blood pressure  pain, tingling, numbness in the hands or feet  severe pain in the chest, back, flanks, or groin  swelling of the ankles, feet, hands  trouble passing urine or change in the amount of urine  unusually weak or tired Side effects that usually do not require medical attention (report to your doctor or health care professional if they continue or are bothersome):  cramps  dark colored stools  diarrhea  headache  nausea, vomiting  stomach upset This list may not describe all possible side effects. Call your doctor for medical advice about side effects. You may report side effects to FDA at 1-800-FDA-1088. Where should I keep my medicine? This drug is given in a hospital or clinic and will not be stored at home. NOTE: This sheet is a summary. It may not cover all   possible information. If you have questions about this medicine, talk to your doctor, pharmacist, or health care provider.  2021 Elsevier/Gold Standard (2008-06-06 15:58:57)   

## 2021-02-12 ENCOUNTER — Encounter: Payer: Self-pay | Admitting: Gastroenterology

## 2021-02-25 ENCOUNTER — Other Ambulatory Visit: Payer: Self-pay

## 2021-02-25 ENCOUNTER — Inpatient Hospital Stay: Payer: BC Managed Care – PPO | Attending: Family

## 2021-02-25 ENCOUNTER — Inpatient Hospital Stay (HOSPITAL_BASED_OUTPATIENT_CLINIC_OR_DEPARTMENT_OTHER): Payer: BC Managed Care – PPO | Admitting: Family

## 2021-02-25 VITALS — BP 114/72 | HR 71 | Temp 98.3°F | Resp 17 | Ht 64.0 in | Wt 214.8 lb

## 2021-02-25 DIAGNOSIS — D500 Iron deficiency anemia secondary to blood loss (chronic): Secondary | ICD-10-CM | POA: Insufficient documentation

## 2021-02-25 DIAGNOSIS — N920 Excessive and frequent menstruation with regular cycle: Secondary | ICD-10-CM | POA: Insufficient documentation

## 2021-02-25 LAB — RETICULOCYTES
Immature Retic Fract: 10.4 % (ref 2.3–15.9)
RBC.: 4.67 MIL/uL (ref 3.87–5.11)
Retic Count, Absolute: 39.2 10*3/uL (ref 19.0–186.0)
Retic Ct Pct: 0.8 % (ref 0.4–3.1)

## 2021-02-25 LAB — CBC WITH DIFFERENTIAL (CANCER CENTER ONLY)
Abs Immature Granulocytes: 0.04 10*3/uL (ref 0.00–0.07)
Basophils Absolute: 0.1 10*3/uL (ref 0.0–0.1)
Basophils Relative: 1 %
Eosinophils Absolute: 0.1 10*3/uL (ref 0.0–0.5)
Eosinophils Relative: 1 %
HCT: 33 % — ABNORMAL LOW (ref 36.0–46.0)
Hemoglobin: 10.2 g/dL — ABNORMAL LOW (ref 12.0–15.0)
Immature Granulocytes: 0 %
Lymphocytes Relative: 31 %
Lymphs Abs: 3.3 10*3/uL (ref 0.7–4.0)
MCH: 21.7 pg — ABNORMAL LOW (ref 26.0–34.0)
MCHC: 30.9 g/dL (ref 30.0–36.0)
MCV: 70.1 fL — ABNORMAL LOW (ref 80.0–100.0)
Monocytes Absolute: 0.6 10*3/uL (ref 0.1–1.0)
Monocytes Relative: 6 %
Neutro Abs: 6.8 10*3/uL (ref 1.7–7.7)
Neutrophils Relative %: 61 %
Platelet Count: 494 10*3/uL — ABNORMAL HIGH (ref 150–400)
RBC: 4.71 MIL/uL (ref 3.87–5.11)
RDW: 21.5 % — ABNORMAL HIGH (ref 11.5–15.5)
WBC Count: 10.9 10*3/uL — ABNORMAL HIGH (ref 4.0–10.5)
nRBC: 0 % (ref 0.0–0.2)

## 2021-02-25 NOTE — Progress Notes (Signed)
Hematology and Oncology Follow Up Visit  Joanna Banks [id number] 01/21/1986 35 y.o. 02/25/2021   Principle Diagnosis:  Iron deficiency anemia   Current Therapy:   IV iron as indicated    Interim History:  Joanna Banks is here today for follow-up. She states that she noted some improvement briefly after her IV iron infusions but since having her cycle she is feeling fatigued again.  Her cycle is heavy but regular. No other obvious blood loss.  No bruising or petechiae.  She is scheduled for endoscopy and colonoscopy tomorrow and has begun her prep.  No fever, chills, n/v, cough, rash, dizziness, SOB, chest pain, palpitations, abdominal pain or changes in bowel or bladder habits.  No swelling, tenderness, numbness or tingling in her extremities at this time.  She has maintained a good appetite and is staying well hydrated. Her weight is stable at 214 lbs.   ECOG Performance Status: 1 - Symptomatic but completely ambulatory  Medications:  Allergies as of 02/25/2021      Reactions   Sulfa Antibiotics Hives      Medication List       Accurate as of Feb 25, 2021  3:27 PM. If you have any questions, ask your nurse or doctor.        Clenpiq 10-3.5-12 MG-GM -GM/160ML Soln Generic drug: Sod Picosulfate-Mag Ox-Cit Acd Take 1 kit by mouth as directed.   metFORMIN 500 MG tablet Commonly known as: GLUCOPHAGE Take 1 tablet (500 mg total) by mouth 2 (two) times daily with a meal.       Allergies:  Allergies  Allergen Reactions  . Sulfa Antibiotics Hives    Past Medical History, Surgical history, Social history, and Family History were reviewed and updated.  Review of Systems: All other 10 point review of systems is negative.   Physical Exam:  vitals were not taken for this visit.   Wt Readings from Last 3 Encounters:  01/15/21 213 lb 1.3 oz (96.7 kg)  01/10/21 218 lb (98.9 kg)  01/07/21 217 lb (98.4 kg)    Ocular: Sclerae unicteric, pupils equal, round and reactive to  light Ear-nose-throat: Oropharynx clear, dentition fair Lymphatic: No cervical or supraclavicular adenopathy Lungs no rales or rhonchi, good excursion bilaterally Heart regular rate and rhythm, no murmur appreciated Abd soft, nontender, positive bowel sounds MSK no focal spinal tenderness, no joint edema Neuro: non-focal, well-oriented, appropriate affect Breasts: Deferred   Lab Results  Component Value Date   WBC 10.9 (H) 02/25/2021   HGB 10.2 (L) 02/25/2021   HCT 33.0 (L) 02/25/2021   MCV 70.1 (L) 02/25/2021   PLT 494 (H) 02/25/2021   Lab Results  Component Value Date   FERRITIN 2.3 (L) 12/10/2020   IRON 14 (L) 01/10/2021   IRONPCTSAT 3.0 (L) 12/10/2020   Lab Results  Component Value Date   RETICCTPCT 0.8 02/25/2021   RBC 4.67 02/25/2021   No results found for: KPAFRELGTCHN, LAMBDASER, KAPLAMBRATIO No results found for: IGGSERUM, IGA, IGMSERUM No results found for: TOTALPROTELP, ALBUMINELP, A1GS, A2GS, BETS, BETA2SER, GAMS, MSPIKE, SPEI   Chemistry      Component Value Date/Time   NA 136 01/15/2021 1442   K 3.4 (L) 01/15/2021 1442   CL 100 01/15/2021 1442   CO2 26 01/15/2021 1442   BUN 7 01/15/2021 1442   CREATININE 0.75 01/15/2021 1442   CREATININE 0.62 02/27/2019 1345      Component Value Date/Time   CALCIUM 9.3 01/15/2021 1442   ALKPHOS 77 01/15/2021 1442     AST 27 01/15/2021 1442   ALT 23 01/15/2021 1442   BILITOT 0.4 01/15/2021 1442       Impression and Plan: Joanna Banks is a very pleasant 35 yo caucasian female with history of iron deficiency anemia due to heavy cycles and GI blood loss.  She will be having an endoscopy and colonoscopy tomorrow to further assess for cause of GI blood loss.  Iron studies are pending. We will replace if needed.  Follow-up in another 3 months with labs every 6 weeks and infusion if needed.  She was encouraged to contact our office with any questions or concerns.   Laverna Peace, NP 5/10/20223:27 PM

## 2021-02-26 ENCOUNTER — Ambulatory Visit (AMBULATORY_SURGERY_CENTER): Payer: BC Managed Care – PPO | Admitting: Gastroenterology

## 2021-02-26 ENCOUNTER — Telehealth: Payer: Self-pay | Admitting: *Deleted

## 2021-02-26 ENCOUNTER — Encounter: Payer: Self-pay | Admitting: Gastroenterology

## 2021-02-26 VITALS — BP 101/66 | HR 66 | Temp 98.2°F | Resp 12 | Ht 64.0 in | Wt 217.0 lb

## 2021-02-26 DIAGNOSIS — K5900 Constipation, unspecified: Secondary | ICD-10-CM

## 2021-02-26 DIAGNOSIS — K2990 Gastroduodenitis, unspecified, without bleeding: Secondary | ICD-10-CM | POA: Diagnosis not present

## 2021-02-26 DIAGNOSIS — R195 Other fecal abnormalities: Secondary | ICD-10-CM

## 2021-02-26 DIAGNOSIS — D509 Iron deficiency anemia, unspecified: Secondary | ICD-10-CM

## 2021-02-26 DIAGNOSIS — R140 Abdominal distension (gaseous): Secondary | ICD-10-CM

## 2021-02-26 DIAGNOSIS — K2970 Gastritis, unspecified, without bleeding: Secondary | ICD-10-CM

## 2021-02-26 DIAGNOSIS — K319 Disease of stomach and duodenum, unspecified: Secondary | ICD-10-CM

## 2021-02-26 DIAGNOSIS — K640 First degree hemorrhoids: Secondary | ICD-10-CM | POA: Diagnosis not present

## 2021-02-26 LAB — IRON AND TIBC
Iron: 23 ug/dL — ABNORMAL LOW (ref 41–142)
Saturation Ratios: 5 % — ABNORMAL LOW (ref 21–57)
TIBC: 431 ug/dL (ref 236–444)
UIBC: 409 ug/dL — ABNORMAL HIGH (ref 120–384)

## 2021-02-26 LAB — FERRITIN: Ferritin: 4 ng/mL — ABNORMAL LOW (ref 11–307)

## 2021-02-26 MED ORDER — SODIUM CHLORIDE 0.9 % IV SOLN: 500.0000 mL | Freq: Once | INTRAVENOUS | Status: DC

## 2021-02-26 NOTE — Telephone Encounter (Signed)
Per scheduling message 02/25/21  Called and gave upcoming appointments for (2) doses of IV Iron

## 2021-02-26 NOTE — Progress Notes (Signed)
[place] vitals and SH IV.

## 2021-02-26 NOTE — Op Note (Signed)
[facility] Patient Name: Joanna Banks Procedure Date: 5/11/2Katina Degree022 1:18 PM MRN: [id number] Endoscopist: Doristine LocksVito Jerae Izard , MD Age: 35 Referring MD:  Date of Birth: 02/10/1986 Gender: Female Account #: [id number] Procedure:                Upper GI endoscopy Indications:              Iron deficiency anemia, Heme positive stool,                            Abdominal bloating Medicines:                Monitored Anesthesia Care Procedure:                Pre-Anesthesia Assessment:                           - Prior to the procedure, a History and Physical                            was performed, and patient medications and                            allergies were reviewed. The patient's tolerance of                            previous anesthesia was also reviewed. The risks                            and benefits of the procedure and the sedation                            options and risks were discussed with the patient.                            All questions were answered, and informed consent                            was obtained. Prior Anticoagulants: The patient has                            taken no previous anticoagulant or antiplatelet                            agents. ASA Grade Assessment: II - A patient with                            mild systemic disease. After reviewing the risks                            and benefits, the patient was deemed in                            satisfactory condition to undergo the procedure.  After obtaining informed consent, the endoscope was                            passed under direct vision. Throughout the                            procedure, the patient's blood pressure, pulse, and                            oxygen saturations were monitored continuously. The                            Endoscope was introduced through the mouth, and                            advanced to the second part of duodenum. The  upper                            GI endoscopy was accomplished without difficulty.                            The patient tolerated the procedure well. Scope In: Scope Out: Findings:                 The examined esophagus was normal.                           The gastroesophageal flap valve was visualized                            endoscopically and classified as Hill Grade II                            (fold present, opens with respiration).                           Scattered minimal inflammation characterized by                            erythema was found in the gastric body and in the                            gastric antrum. Biopsies were taken with a cold                            forceps for Helicobacter pylori testing. Estimated                            blood loss was minimal.                           The examined duodenum was normal. Biopsies for                            histology were taken with a  cold forceps for                            evaluation of celiac disease. Estimated blood loss                            was minimal. Complications:            No immediate complications. Estimated Blood Loss:     Estimated blood loss was minimal. Impression:               - Normal esophagus.                           - Gastroesophageal flap valve classified as Hill                            Grade II (fold present, opens with respiration).                           - Gastritis. Biopsied.                           - Normal examined duodenum. Biopsied. Recommendation:           - Patient has a contact number available for                            emergencies. The signs and symptoms of potential                            delayed complications were discussed with the                            patient. Return to normal activities tomorrow.                            Written discharge instructions were provided to the                            patient.                            - Resume previous diet.                           - Continue present medications.                           - Await pathology results.                           - Colonoscopy today.                           - If biopsies are unrevealing, will schedule video  capsule endoscopy at appointment to be scheduled to                            visualize the small bowel. Doristine Locks, MD 02/26/2021 1:57:46 PM

## 2021-02-26 NOTE — Progress Notes (Signed)
Report to PACU, RN, vss, BBS= Clear.  

## 2021-02-26 NOTE — Progress Notes (Signed)
Called to room to assist during endoscopic procedure.  Patient ID and intended procedure confirmed with present staff. Received instructions for my participation in the procedure from the performing physician.  

## 2021-02-26 NOTE — Telephone Encounter (Signed)
Per 02/25/21 los - called and gave upcoming appointments - mailed calendar 

## 2021-02-26 NOTE — Patient Instructions (Signed)
Handout given for hemorrhoids.  Await biopsy results.  Next colonoscopy in 10 years.  Return to GI office in 3 months, they will schedule.  YOU HAD AN ENDOSCOPIC PROCEDURE TODAY AT THE [facility]:   Refer to the procedure report that was given to you for any specific questions about what was found during the examination.  If the procedure report does not answer your questions, please call your gastroenterologist to clarify.  If you requested that your care partner not be given the details of your procedure findings, then the procedure report has been included in a sealed envelope for you to review at your convenience later.  YOU SHOULD EXPECT: Some feelings of bloating in the abdomen. Passage of more gas than usual.  Walking can help get rid of the air that was put into your GI tract during the procedure and reduce the bloating. If you had a lower endoscopy (such as a colonoscopy or flexible sigmoidoscopy) you may notice spotting of blood in your stool or on the toilet paper. If you underwent a bowel prep for your procedure, you may not have a normal bowel movement for a few days.  Please Note:  You might notice some irritation and congestion in your nose or some drainage.  This is from the oxygen used during your procedure.  There is no need for concern and it should clear up in a day or so.  SYMPTOMS TO REPORT IMMEDIATELY:   Following lower endoscopy (colonoscopy or flexible sigmoidoscopy):  Excessive amounts of blood in the stool  Significant tenderness or worsening of abdominal pains  Swelling of the abdomen that is new, acute  Fever of 100F or higher   Following upper endoscopy (EGD)  Vomiting of blood or coffee ground material  New chest pain or pain under the shoulder blades  Painful or persistently difficult swallowing  New shortness of breath  Fever of 100F or higher  Black, tarry-looking stools  For urgent or emergent issues, a gastroenterologist can be  reached at any hour by calling [phone]. Do not use MyChart messaging for urgent concerns.    DIET:  We do recommend a small meal at first, but then you may proceed to your regular diet.  Drink plenty of fluids but you should avoid alcoholic beverages for 24 hours.  ACTIVITY:  You should plan to take it easy for the rest of today and you should NOT DRIVE or use heavy machinery until tomorrow (because of the sedation medicines used during the test).    FOLLOW UP: Our staff will call the number listed on your records 48-72 hours following your procedure to check on you and address any questions or concerns that you may have regarding the information given to you following your procedure. If we do not reach you, we will leave a message.  We will attempt to reach you two times.  During this call, we will ask if you have developed any symptoms of COVID 19. If you develop any symptoms (ie: fever, flu-like symptoms, shortness of breath, cough etc.) before then, please call [phone].  If you test positive for Covid 19 in the 2 weeks post procedure, please call and report this information to [place].    If any biopsies were taken you will be contacted by phone or by letter within the next 1-3 weeks.  Please call us at [phone] if you have not heard about the biopsies in 3 weeks.    SIGNATURES/CONFIDENTIALITY: You and/or your care  partner have signed paperwork which will be entered into your electronic medical record.  These signatures attest to the fact that that the information above on your After Visit Summary has been reviewed and is understood.  Full responsibility of the confidentiality of this discharge information lies with you and/or your care-partner.

## 2021-02-26 NOTE — Op Note (Signed)
[facility] Patient Name: Joanna Banks Procedure Date: 02/26/2021 1:17 PM MRN: [id number] Endoscopist: Doristine Locks , MD Age: 35 Referring MD:  Date of Birth: [birth year] Gender: Female Account #: [id number] Procedure:                Colonoscopy Indications:              Heme positive stool, Iron deficiency anemia,                            Constipation Medicines:                Monitored Anesthesia Care Procedure:                Pre-Anesthesia Assessment:                           - Prior to the procedure, a History and Physical                            was performed, and patient medications and                            allergies were reviewed. The patient's tolerance of                            previous anesthesia was also reviewed. The risks                            and benefits of the procedure and the sedation                            options and risks were discussed with the patient.                            All questions were answered, and informed consent                            was obtained. Prior Anticoagulants: The patient has                            taken no previous anticoagulant or antiplatelet                            agents. ASA Grade Assessment: II - A patient with                            mild systemic disease. After reviewing the risks                            and benefits, the patient was deemed in                            satisfactory condition to undergo the procedure.  After obtaining informed consent, the colonoscope                            was passed under direct vision. Throughout the                            procedure, the patient's blood pressure, pulse, and                            oxygen saturations were monitored continuously. The                            Colonoscope was introduced through the anus and                            advanced to the the terminal ileum. The colonoscopy                             was performed without difficulty. The patient                            tolerated the procedure well. The quality of the                            bowel preparation was excellent. The terminal                            ileum, ileocecal valve, appendiceal orifice, and                            rectum were photographed. Scope In: 1:34:11 PM Scope Out: 1:48:21 PM Scope Withdrawal Time: 0 hours 10 minutes 42 seconds  Total Procedure Duration: 0 hours 14 minutes 10 seconds  Findings:                 The perianal and digital rectal examinations were                            normal.                           The colon (entire examined portion) appeared normal.                           Non-bleeding internal hemorrhoids were found during                            retroflexion. The hemorrhoids were small and Grade                            I (internal hemorrhoids that do not prolapse).                           The terminal ileum appeared normal. Complications:            No immediate complications.  Estimated Blood Loss:     Estimated blood loss: none. Impression:               - The entire examined colon is normal.                           - Non-bleeding internal hemorrhoids.                           - The examined portion of the ileum was normal.                           - No specimens collected. Recommendation:           - Patient has a contact number available for                            emergencies. The signs and symptoms of potential                            delayed complications were discussed with the                            patient. Return to normal activities tomorrow.                            Written discharge instructions were provided to the                            patient.                           - Resume previous diet.                           - Continue present medications.                           - Repeat colonoscopy in 10  years for screening                            purposes.                           - Return to GI office in 3 months. Doristine Locks, MD 02/26/2021 2:02:18 PM

## 2021-02-28 ENCOUNTER — Inpatient Hospital Stay: Payer: BC Managed Care – PPO

## 2021-02-28 ENCOUNTER — Other Ambulatory Visit: Payer: Self-pay

## 2021-02-28 ENCOUNTER — Telehealth: Payer: Self-pay

## 2021-02-28 ENCOUNTER — Ambulatory Visit: Payer: BC Managed Care – PPO

## 2021-02-28 VITALS — BP 119/67 | HR 73 | Temp 99.4°F

## 2021-02-28 DIAGNOSIS — D500 Iron deficiency anemia secondary to blood loss (chronic): Secondary | ICD-10-CM | POA: Diagnosis not present

## 2021-02-28 MED ORDER — SODIUM CHLORIDE 0.9 % IV SOLN
125.0000 mg | Freq: Once | INTRAVENOUS | Status: AC
  Administered 2021-02-28: 125 mg via INTRAVENOUS
  Filled 2021-02-28: qty 10

## 2021-02-28 MED ORDER — SODIUM CHLORIDE 0.9 % IV SOLN
Freq: Once | INTRAVENOUS | Status: AC
  Filled 2021-02-28: qty 250

## 2021-02-28 NOTE — Telephone Encounter (Signed)
LVM

## 2021-02-28 NOTE — Patient Instructions (Signed)
Ferric carboxymaltose injection What is this medicine? FERRIC CARBOXYMALTOSE (ferr-ik car-box-ee-mol-toes) is an iron complex. Iron is used to make healthy red blood cells, which carry oxygen and nutrients throughout the body. This medicine is used to treat anemia in people with chronic kidney disease or people who cannot take iron by mouth. This medicine may be used for other purposes; ask your health care provider or pharmacist if you have questions. COMMON BRAND NAME(S): Injectafer What should I tell my health care provider before I take this medicine? They need to know if you have any of these conditions:  high levels of iron in the blood  liver disease  an unusual or allergic reaction to iron, other medicines, foods, dyes, or preservatives  pregnant or trying to get pregnant  breast-feeding How should I use this medicine? This medicine is for infusion into a vein. It is given by a health care professional in a hospital or clinic setting. Talk to your pediatrician regarding the use of this medicine in children. Special care may be needed. Overdosage: If you think you have taken too much of this medicine contact a poison control center or emergency room at once. NOTE: This medicine is only for you. Do not share this medicine with others. What if I miss a dose? Keep appointments for follow-up doses. It is important not to miss your dose. Call your care team if you are unable to keep an appointment. What may interact with this medicine? Do not take this medicine with any of the following medications:  deferoxamine  dimercaprol  other iron products This list may not describe all possible interactions. Give your health care provider a list of all the medicines, herbs, non-prescription drugs, or dietary supplements you use. Also tell them if you smoke, drink alcohol, or use illegal drugs. Some items may interact with your medicine. What should I watch for while using this  medicine? Visit your doctor or health care professional regularly. Tell your doctor if your symptoms do not start to get better or if they get worse. You may need blood work done while you are taking this medicine. You may need to follow a special diet. Talk to your doctor. Foods that contain iron include: whole grains/cereals, dried fruits, beans, or peas, leafy green vegetables, and organ meats (liver, kidney). What side effects may I notice from receiving this medicine? Side effects that you should report to your doctor or health care professional as soon as possible:  allergic reactions like skin rash, itching or hives, swelling of the face, lips, or tongue  dizziness  facial flushing Side effects that usually do not require medical attention (report to your doctor or health care professional if they continue or are bothersome):  changes in taste  constipation  headache  nausea, vomiting  pain, redness, or irritation at site where injected This list may not describe all possible side effects. Call your doctor for medical advice about side effects. You may report side effects to FDA at 1-800-FDA-1088. Where should I keep my medicine? This drug is given in a hospital or clinic and will not be stored at home. NOTE: This sheet is a summary. It may not cover all possible information. If you have questions about this medicine, talk to your doctor, pharmacist, or health care provider.  2021 Elsevier/Gold Standard (2020-09-17 14:00:47)  

## 2021-03-05 ENCOUNTER — Telehealth: Payer: Self-pay | Admitting: *Deleted

## 2021-03-05 NOTE — Telephone Encounter (Signed)
Left patient a message that appointment is being scheduled for 03/27/2021 with Dr. Earlene Plater.

## 2021-03-07 ENCOUNTER — Other Ambulatory Visit: Payer: Self-pay

## 2021-03-07 ENCOUNTER — Inpatient Hospital Stay: Payer: BC Managed Care – PPO

## 2021-03-07 VITALS — BP 109/67 | HR 68 | Temp 99.2°F

## 2021-03-07 DIAGNOSIS — D500 Iron deficiency anemia secondary to blood loss (chronic): Secondary | ICD-10-CM | POA: Diagnosis not present

## 2021-03-07 MED ORDER — SODIUM CHLORIDE 0.9 % IV SOLN
Freq: Once | INTRAVENOUS | Status: AC
  Filled 2021-03-07: qty 250

## 2021-03-07 MED ORDER — SODIUM CHLORIDE 0.9 % IV SOLN
125.0000 mg | Freq: Once | INTRAVENOUS | Status: AC
  Administered 2021-03-07: 125 mg via INTRAVENOUS
  Filled 2021-03-07: qty 10

## 2021-03-07 NOTE — Patient Instructions (Signed)
Sodium Ferric Gluconate Complex injection What is this medicine? SODIUM FERRIC GLUCONATE COMPLEX (SOE dee um FER ik GLOO koe nate KOM pleks) is an iron replacement. It is used with epoetin therapy to treat low iron levels in patients who are receiving hemodialysis. This medicine may be used for other purposes; ask your health care provider or pharmacist if you have questions. COMMON BRAND NAME(S): Ferrlecit, Nulecit What should I tell my health care provider before I take this medicine? They need to know if you have any of the following conditions:  anemia that is not from iron deficiency  high levels of iron in the body  an unusual or allergic reaction to iron, benzyl alcohol, other medicines, foods, dyes, or preservatives  pregnant or are trying to become pregnant  breast-feeding How should I use this medicine? This medicine is for infusion into a vein. It is given by a health care professional in a hospital or clinic setting. Talk to your pediatrician regarding the use of this medicine in children. While this drug may be prescribed for children as young as 6 years old for selected conditions, precautions do apply. Overdosage: If you think you have taken too much of this medicine contact a poison control center or emergency room at once. NOTE: This medicine is only for you. Do not share this medicine with others. What if I miss a dose? It is important not to miss your dose. Call your doctor or health care professional if you are unable to keep an appointment. What may interact with this medicine? Do not take this medicine with any of the following medications:  deferoxamine  dimercaprol  other iron products This medicine may also interact with the following medications:  chloramphenicol  deferasirox  medicine for blood pressure like enalapril This list may not describe all possible interactions. Give your health care provider a list of all the medicines, herbs,  non-prescription drugs, or dietary supplements you use. Also tell them if you smoke, drink alcohol, or use illegal drugs. Some items may interact with your medicine. What should I watch for while using this medicine? Your condition will be monitored carefully while you are receiving this medicine. Visit your doctor for check-ups as directed. What side effects may I notice from receiving this medicine? Side effects that you should report to your doctor or health care professional as soon as possible:  allergic reactions like skin rash, itching or hives, swelling of the face, lips, or tongue  breathing problems  changes in hearing  changes in vision  chills, flushing, or sweating  fast, irregular heartbeat  feeling faint or lightheaded, falls  fever, flu-like symptoms  high or low blood pressure  pain, tingling, numbness in the hands or feet  severe pain in the chest, back, flanks, or groin  swelling of the ankles, feet, hands  trouble passing urine or change in the amount of urine  unusually weak or tired Side effects that usually do not require medical attention (report to your doctor or health care professional if they continue or are bothersome):  cramps  dark colored stools  diarrhea  headache  nausea, vomiting  stomach upset This list may not describe all possible side effects. Call your doctor for medical advice about side effects. You may report side effects to FDA at 1-800-FDA-1088. Where should I keep my medicine? This drug is given in a hospital or clinic and will not be stored at home. NOTE: This sheet is a summary. It may not cover all   possible information. If you have questions about this medicine, talk to your doctor, pharmacist, or health care provider.  2021 Elsevier/Gold Standard (2008-06-06 15:58:57)   

## 2021-03-13 ENCOUNTER — Encounter: Payer: Self-pay | Admitting: Gastroenterology

## 2021-03-27 ENCOUNTER — Encounter: Payer: Self-pay | Admitting: Obstetrics and Gynecology

## 2021-03-27 ENCOUNTER — Other Ambulatory Visit: Payer: Self-pay

## 2021-03-27 ENCOUNTER — Ambulatory Visit: Payer: BC Managed Care – PPO | Admitting: Obstetrics and Gynecology

## 2021-03-27 VITALS — BP 113/76 | HR 69 | Ht 64.0 in | Wt 214.0 lb

## 2021-03-27 DIAGNOSIS — R102 Pelvic and perineal pain: Secondary | ICD-10-CM

## 2021-03-27 DIAGNOSIS — D509 Iron deficiency anemia, unspecified: Secondary | ICD-10-CM

## 2021-03-27 DIAGNOSIS — N939 Abnormal uterine and vaginal bleeding, unspecified: Secondary | ICD-10-CM

## 2021-03-27 NOTE — Progress Notes (Signed)
   GYNECOLOGY OFFICE FOLLOW UP NOTE  History:  35 y.o. [id number] here today for follow up for heavy periods. Has iron deficiency anemia of unknown origin. GI cause ruled out. She has monthly periods that are less heavy than they used to be, lasting 5-6 days with middle three days needing to change a pad/tampon every 3 hours. Has had IV iron infusions and felt significantly better after the last one, fatigue was gone and then returned as soon as her period started.  Vulvar pain improved with change in condom brand.     Past Medical History:  Diagnosis Date   Anemia    Anxiety    History of gallstones    Obesity    PCOS (polycystic ovarian syndrome)    UTI (urinary tract infection)     Past Surgical History:  Procedure Laterality Date   CESAREAN SECTION  2012   CHOLECYSTECTOMY  11/2019   [facility]   WISDOM TOOTH EXTRACTION       Current Outpatient Medications:    metFORMIN (GLUCOPHAGE) 500 MG tablet, Take 1 tablet (500 mg total) by mouth 2 (two) times daily with a meal., Disp: 180 tablet, Rfl: 2  The following portions of the patient's history were reviewed and updated as appropriate: allergies, current medications, past family history, past medical history, past social history, past surgical history and problem list.   Review of Systems:  Pertinent items noted in HPI and remainder of comprehensive ROS otherwise negative.   Objective:  Physical Exam BP 113/76   Pulse 69   Ht 5\' 4"  (1.626 m)   Wt 214 lb (97.1 kg)   LMP 03/13/2021   BMI 36.73 kg/m  CONSTITUTIONAL: Well-developed, well-nourished female in no acute distress.  HENT:  Normocephalic, atraumatic. External right and left ear normal. Oropharynx is clear and moist EYES: Conjunctivae and EOM are normal. Pupils are equal, round, and reactive to light. No scleral icterus.  NECK: Normal range of motion, supple, no masses SKIN: Skin is warm and dry. No rash noted. Not diaphoretic. No erythema. No  pallor. NEUROLOGIC: Alert and oriented to person, place, and time. Normal reflexes, muscle tone coordination. No cranial nerve deficit noted. PSYCHIATRIC: Normal mood and affect. Normal behavior. Normal judgment and thought content. CARDIOVASCULAR: Normal heart rate noted RESPIRATORY: Effort normal, no problems with respiration noted ABDOMEN: no distention noted.   PELVIC: deferred MUSCULOSKELETAL: Normal range of motion. No edema noted.  Labs and Imaging No results found.  Assessment & Plan:  1. Abnormal uterine bleeding (AUB) Patient with mild anemia and significant iron deficiency - would expect mild anemia with amount of bleeding patient describes with periods, however would not anticipate periods to be sole source of iron deficiency - I have added on a hemoglobinopathy to her labs as I cannot find one done in the past - check 03/15/2021 to rule out structural issue - US PELVIC COMPLETE WITH TRANSVAGINAL; Future  2. Iron deficiency anemia, unspecified iron deficiency anemia type - Hemoglobinopathy Evaluation; Future  3. Vulvar pain Improved with change in condom brand   Routine preventative health maintenance measures emphasized. Please refer to After Visit Summary for other counseling recommendations.   Return in about 4 weeks (around 04/24/2021) for Followup.  06/25/2021, MD, [facility])

## 2021-03-27 NOTE — Patient Instructions (Signed)
Levonorgestrel intrauterine device (IUD) What is this medicine? LEVONORGESTREL IUD (LEE voe nor jes trel) is a contraceptive (birth control) device. The device is placed inside the uterus by a health care provider. It is used to prevent pregnancy. Some devices can also be used to treat heavy bleeding that occurs during your period. This medicine may be used for other purposes; ask your health care provider or pharmacist if you have questions. COMMON BRAND NAME(S): Kyleena, LILETTA, Mirena, Skyla What should I tell my health care provider before I take this medicine? They need to know if you have any of these conditions:  abnormal Pap smear  cancer of the breast, uterus, or cervix  diabetes  endometritis  genital or pelvic infection now or in the past  have more than one sexual partner or your partner has more than one partner  heart disease  history of an ectopic or tubal pregnancy  immune system problems  IUD in place  liver disease or tumor  problems with blood clots or take blood-thinners  seizures  use intravenous drugs  uterus of unusual shape  vaginal bleeding that has not been explained  an unusual or allergic reaction to levonorgestrel, other hormones, silicone, or polyethylene, medicines, foods, dyes, or preservatives  pregnant or trying to get pregnant  breast-feeding How should I use this medicine? This device is placed inside the uterus by a health care professional. Talk to your pediatrician regarding the use of this medicine in children. Special care may be needed. Overdosage: If you think you have taken too much of this medicine contact a poison control center or emergency room at once. NOTE: This medicine is only for you. Do not share this medicine with others. What if I miss a dose? This does not apply. Depending on the brand of device you have inserted, the device will need to be replaced every 3 to 7 years if you wish to continue using this type  of birth control. What may interact with this medicine? Do not take this medicine with any of the following medications:  amprenavir  bosentan  fosamprenavir This medicine may also interact with the following medications:  aprepitant  armodafinil  barbiturate medicines for inducing sleep or treating seizures  bexarotene  boceprevir  griseofulvin  medicines to treat seizures like carbamazepine, ethotoin, felbamate, oxcarbazepine, phenytoin, topiramate  modafinil  pioglitazone  rifabutin  rifampin  rifapentine  some medicines to treat HIV infection like atazanavir, efavirenz, indinavir, lopinavir, nelfinavir, tipranavir, ritonavir  St. John's wort  warfarin This list may not describe all possible interactions. Give your health care provider a list of all the medicines, herbs, non-prescription drugs, or dietary supplements you use. Also tell them if you smoke, drink alcohol, or use illegal drugs. Some items may interact with your medicine. What should I watch for while using this medicine? Visit your doctor or health care professional for regular check ups. See your doctor if you or your partner has sexual contact with others, becomes HIV positive, or gets a sexual transmitted disease. This product does not protect you against HIV infection (AIDS) or other sexually transmitted diseases. You can check the placement of the IUD yourself by reaching up to the top of your vagina with clean fingers to feel the threads. Do not pull on the threads. It is a good habit to check placement after each menstrual period. Call your doctor right away if you feel more of the IUD than just the threads or if you cannot feel the threads   at all. The IUD may come out by itself. You may become pregnant if the device comes out. If you notice that the IUD has come out use a backup birth control method like condoms and call your health care provider. Using tampons will not change the position of the  IUD and are okay to use during your period. This IUD can be safely scanned with magnetic resonance imaging (MRI) only under specific conditions. Before you have an MRI, tell your healthcare provider that you have an IUD in place, and which type of IUD you have in place. What side effects may I notice from receiving this medicine? Side effects that you should report to your doctor or health care professional as soon as possible:  allergic reactions like skin rash, itching or hives, swelling of the face, lips, or tongue  fever, flu-like symptoms  genital sores  high blood pressure  no menstrual period for 6 weeks during use  pain, swelling, warmth in the leg  pelvic pain or tenderness  severe or sudden headache  signs of pregnancy  stomach cramping  sudden shortness of breath  trouble with balance, talking, or walking  unusual vaginal bleeding, discharge  yellowing of the eyes or skin Side effects that usually do not require medical attention (report to your doctor or health care professional if they continue or are bothersome):  acne  breast pain  change in sex drive or performance  changes in weight  cramping, dizziness, or faintness while the device is being inserted  headache  irregular menstrual bleeding within first 3 to 6 months of use  nausea This list may not describe all possible side effects. Call your doctor for medical advice about side effects. You may report side effects to FDA at 1-800-FDA-1088. Where should I keep my medicine? This does not apply. NOTE: This sheet is a summary. It may not cover all possible information. If you have questions about this medicine, talk to your doctor, pharmacist, or health care provider.  2021 Elsevier/Gold Standard (2020-06-04 16:27:45)  

## 2021-04-02 ENCOUNTER — Other Ambulatory Visit: Payer: Self-pay

## 2021-04-02 ENCOUNTER — Ambulatory Visit (INDEPENDENT_AMBULATORY_CARE_PROVIDER_SITE_OTHER): Payer: BC Managed Care – PPO

## 2021-04-02 DIAGNOSIS — N939 Abnormal uterine and vaginal bleeding, unspecified: Secondary | ICD-10-CM

## 2021-04-07 ENCOUNTER — Other Ambulatory Visit: Payer: Self-pay | Admitting: *Deleted

## 2021-04-07 DIAGNOSIS — D500 Iron deficiency anemia secondary to blood loss (chronic): Secondary | ICD-10-CM

## 2021-04-07 DIAGNOSIS — D649 Anemia, unspecified: Secondary | ICD-10-CM

## 2021-04-08 ENCOUNTER — Inpatient Hospital Stay: Payer: BC Managed Care – PPO | Attending: Family

## 2021-04-08 ENCOUNTER — Other Ambulatory Visit: Payer: Self-pay | Admitting: *Deleted

## 2021-04-08 ENCOUNTER — Other Ambulatory Visit: Payer: Self-pay

## 2021-04-08 DIAGNOSIS — D500 Iron deficiency anemia secondary to blood loss (chronic): Secondary | ICD-10-CM | POA: Insufficient documentation

## 2021-04-08 DIAGNOSIS — N920 Excessive and frequent menstruation with regular cycle: Secondary | ICD-10-CM | POA: Insufficient documentation

## 2021-04-08 DIAGNOSIS — D649 Anemia, unspecified: Secondary | ICD-10-CM

## 2021-04-08 DIAGNOSIS — D509 Iron deficiency anemia, unspecified: Secondary | ICD-10-CM

## 2021-04-08 LAB — RETICULOCYTES
Immature Retic Fract: 9.3 % (ref 2.3–15.9)
RBC.: 4.95 MIL/uL (ref 3.87–5.11)
Retic Count, Absolute: 24.8 10*3/uL (ref 19.0–186.0)
Retic Ct Pct: 0.5 % (ref 0.4–3.1)

## 2021-04-08 LAB — CBC WITH DIFFERENTIAL (CANCER CENTER ONLY)
Abs Immature Granulocytes: 0.07 10*3/uL (ref 0.00–0.07)
Basophils Absolute: 0 10*3/uL (ref 0.0–0.1)
Basophils Relative: 1 %
Eosinophils Absolute: 0 10*3/uL (ref 0.0–0.5)
Eosinophils Relative: 0 %
HCT: 36.2 % (ref 36.0–46.0)
Hemoglobin: 11.5 g/dL — ABNORMAL LOW (ref 12.0–15.0)
Immature Granulocytes: 1 %
Lymphocytes Relative: 29 %
Lymphs Abs: 2.5 10*3/uL (ref 0.7–4.0)
MCH: 23.5 pg — ABNORMAL LOW (ref 26.0–34.0)
MCHC: 31.8 g/dL (ref 30.0–36.0)
MCV: 73.9 fL — ABNORMAL LOW (ref 80.0–100.0)
Monocytes Absolute: 0.6 10*3/uL (ref 0.1–1.0)
Monocytes Relative: 7 %
Neutro Abs: 5.4 10*3/uL (ref 1.7–7.7)
Neutrophils Relative %: 62 %
Platelet Count: 395 10*3/uL (ref 150–400)
RBC: 4.9 MIL/uL (ref 3.87–5.11)
RDW: 19.6 % — ABNORMAL HIGH (ref 11.5–15.5)
WBC Count: 8.7 10*3/uL (ref 4.0–10.5)
nRBC: 0 % (ref 0.0–0.2)

## 2021-04-09 ENCOUNTER — Encounter: Payer: Self-pay | Admitting: *Deleted

## 2021-04-09 LAB — IRON AND TIBC
Iron: 120 ug/dL (ref 41–142)
Saturation Ratios: 31 % (ref 21–57)
TIBC: 386 ug/dL (ref 236–444)
UIBC: 266 ug/dL (ref 120–384)

## 2021-04-09 LAB — FERRITIN: Ferritin: 14 ng/mL (ref 11–307)

## 2021-05-15 ENCOUNTER — Ambulatory Visit: Payer: BC Managed Care – PPO | Admitting: Obstetrics and Gynecology

## 2021-05-15 ENCOUNTER — Encounter: Payer: Self-pay | Admitting: Obstetrics and Gynecology

## 2021-05-28 ENCOUNTER — Inpatient Hospital Stay (HOSPITAL_BASED_OUTPATIENT_CLINIC_OR_DEPARTMENT_OTHER): Payer: BC Managed Care – PPO | Admitting: Family

## 2021-05-28 ENCOUNTER — Other Ambulatory Visit: Payer: Self-pay

## 2021-05-28 ENCOUNTER — Inpatient Hospital Stay: Payer: BC Managed Care – PPO | Attending: Family

## 2021-05-28 ENCOUNTER — Encounter: Payer: Self-pay | Admitting: Family

## 2021-05-28 VITALS — BP 122/75 | HR 69 | Temp 99.3°F | Resp 17 | Wt 210.0 lb

## 2021-05-28 DIAGNOSIS — Z79899 Other long term (current) drug therapy: Secondary | ICD-10-CM | POA: Insufficient documentation

## 2021-05-28 DIAGNOSIS — D500 Iron deficiency anemia secondary to blood loss (chronic): Secondary | ICD-10-CM

## 2021-05-28 DIAGNOSIS — N920 Excessive and frequent menstruation with regular cycle: Secondary | ICD-10-CM | POA: Diagnosis present

## 2021-05-28 DIAGNOSIS — D509 Iron deficiency anemia, unspecified: Secondary | ICD-10-CM | POA: Diagnosis not present

## 2021-05-28 DIAGNOSIS — Z882 Allergy status to sulfonamides status: Secondary | ICD-10-CM | POA: Insufficient documentation

## 2021-05-28 LAB — RETICULOCYTES
Immature Retic Fract: 9.2 % (ref 2.3–15.9)
RBC.: 4.77 MIL/uL (ref 3.87–5.11)
Retic Count, Absolute: 48.2 10*3/uL (ref 19.0–186.0)
Retic Ct Pct: 1 % (ref 0.4–3.1)

## 2021-05-28 LAB — CBC WITH DIFFERENTIAL (CANCER CENTER ONLY)
Abs Immature Granulocytes: 0.03 10*3/uL (ref 0.00–0.07)
Basophils Absolute: 0 10*3/uL (ref 0.0–0.1)
Basophils Relative: 0 %
Eosinophils Absolute: 0.1 10*3/uL (ref 0.0–0.5)
Eosinophils Relative: 1 %
HCT: 36 % (ref 36.0–46.0)
Hemoglobin: 11.5 g/dL — ABNORMAL LOW (ref 12.0–15.0)
Immature Granulocytes: 0 %
Lymphocytes Relative: 26 %
Lymphs Abs: 2.9 10*3/uL (ref 0.7–4.0)
MCH: 24.5 pg — ABNORMAL LOW (ref 26.0–34.0)
MCHC: 31.9 g/dL (ref 30.0–36.0)
MCV: 76.6 fL — ABNORMAL LOW (ref 80.0–100.0)
Monocytes Absolute: 0.7 10*3/uL (ref 0.1–1.0)
Monocytes Relative: 6 %
Neutro Abs: 7.6 10*3/uL (ref 1.7–7.7)
Neutrophils Relative %: 67 %
Platelet Count: 422 10*3/uL — ABNORMAL HIGH (ref 150–400)
RBC: 4.7 MIL/uL (ref 3.87–5.11)
RDW: 17.1 % — ABNORMAL HIGH (ref 11.5–15.5)
WBC Count: 11.3 10*3/uL — ABNORMAL HIGH (ref 4.0–10.5)
nRBC: 0 % (ref 0.0–0.2)

## 2021-05-28 LAB — IRON AND TIBC
Iron: 43 ug/dL (ref 28–170)
Saturation Ratios: 9 % — ABNORMAL LOW (ref 10.4–31.8)
TIBC: 504 ug/dL — ABNORMAL HIGH (ref 250–450)
UIBC: 461 ug/dL

## 2021-05-28 LAB — FERRITIN: Ferritin: 3 ng/mL — ABNORMAL LOW (ref 11–307)

## 2021-05-28 MED ORDER — FUSION PLUS PO CAPS
1.0000 | ORAL_CAPSULE | Freq: Every day | ORAL | 11 refills | Status: DC
Start: 2021-05-28 — End: 2021-12-16

## 2021-05-28 NOTE — Progress Notes (Signed)
Hematology and Oncology Follow Up Visit  Joanna Banks [id number] 1986/03/23 35 y.o. 05/28/2021   Principle Diagnosis:  Iron deficiency anemia    Current Therapy:        IV iron as indicated   Interim History:  Joanna Banks is here today for follow-up. She is doing failry well but notes that her fatigue ice cravings are returning since receiving IV iron in May.  She also is concerned about the cost of the IV iron. She received a large bill despite having met her deductible and is concerned about needing future infusions. I gave her the number for [facility]'s billing department for further help.  She had a colonoscopy in May 2022 which only showed non bleeding internal hemorrhoids. EGD that same day showed gastritis. Negative for H. pylori.  Her cycle is regular and not heavy. No other blood loss noted. No bruising or petechiae.  No fever, chills, n/v, cough, rash, dizziness, SOB, chest pain, palpitations, abdominal pain or changes in bowel or bladder habits.  No swelling, tenderness, numbness or tingling in her extremities.  No falls or syncope.  She has maintained a good appetite and is staying well hydrated. Her weight is stable at 210 lbs.   ECOG Performance Status: 1 - Symptomatic but completely ambulatory  Medications:  Allergies as of 05/28/2021       Reactions   Sulfa Antibiotics Hives        Medication List        Accurate as of May 28, 2021  1:18 PM. If you have any questions, ask your nurse or doctor.          metFORMIN 500 MG tablet Commonly known as: GLUCOPHAGE Take 1 tablet (500 mg total) by mouth 2 (two) times daily with a meal.        Allergies:  Allergies  Allergen Reactions   Sulfa Antibiotics Hives    Past Medical History, Surgical history, Social history, and Family History were reviewed and updated.  Review of Systems: All other 10 point review of systems is negative.   Physical Exam:  vitals were not taken for this visit.   Wt Readings  from Last 3 Encounters:  03/27/21 214 lb (97.1 kg)  02/26/21 217 lb (98.4 kg)  02/25/21 214 lb 12.8 oz (97.4 kg)    Ocular: Sclerae unicteric, pupils equal, round and reactive to light Ear-nose-throat: Oropharynx clear, dentition fair Lymphatic: No cervical or supraclavicular adenopathy Lungs no rales or rhonchi, good excursion bilaterally Heart regular rate and rhythm, no murmur appreciated Abd soft, nontender, positive bowel sounds MSK no focal spinal tenderness, no joint edema Neuro: non-focal, well-oriented, appropriate affect Breasts: Deferred   Lab Results  Component Value Date   WBC 11.3 (H) 05/28/2021   HGB 11.5 (L) 05/28/2021   HCT 36.0 05/28/2021   MCV 76.6 (L) 05/28/2021   PLT 422 (H) 05/28/2021   Lab Results  Component Value Date   FERRITIN 14 04/08/2021   IRON 120 04/08/2021   TIBC 386 04/08/2021   UIBC 266 04/08/2021   IRONPCTSAT 31 04/08/2021   Lab Results  Component Value Date   RETICCTPCT 1.0 05/28/2021   RBC 4.77 05/28/2021   No results found for: KPAFRELGTCHN, LAMBDASER, KAPLAMBRATIO No results found for: IGGSERUM, IGA, IGMSERUM No results found for: TOTALPROTELP, ALBUMINELP, A1GS, A2GS, BETS, BETA2SER, GAMS, MSPIKE, SPEI   Chemistry      Component Value Date/Time   NA 136 01/15/2021 1442   K 3.4 (L) 01/15/2021 1442   CL  100 01/15/2021 1442   CO2 26 01/15/2021 1442   BUN 7 01/15/2021 1442   CREATININE 0.75 01/15/2021 1442   CREATININE 0.62 02/27/2019 1345      Component Value Date/Time   CALCIUM 9.3 01/15/2021 1442   ALKPHOS 77 01/15/2021 1442   AST 27 01/15/2021 1442   ALT 23 01/15/2021 1442   BILITOT 0.4 01/15/2021 1442       Impression and Plan: Joanna Banks is a very pleasant 35 yo caucasian female with history of iron deficiency anemia due to heavy cycles and GI blood loss.  Iron studies are pending. We will have her start taking fusion plus 1 capsule PO daily. No IV iron at this time due to cost.  Lab check in 3 months and  follow-up in 6 months.  She can contact our office with any questions or concerns.   Laverna Peace, NP 8/10/20221:18 PM

## 2021-05-30 ENCOUNTER — Telehealth: Payer: Self-pay | Admitting: *Deleted

## 2021-05-30 NOTE — Telephone Encounter (Signed)
Per 05/28/21 los - gave upcoming appointments - confirmed 

## 2021-06-05 ENCOUNTER — Telehealth: Payer: Self-pay | Admitting: Medical

## 2021-06-05 NOTE — Telephone Encounter (Signed)
Ok, form in provider's folder

## 2021-06-05 NOTE — Telephone Encounter (Signed)
Patient brought in form to be filled out by Joanna Banks Placed form in bin up front for him to fill out  Patient would like to be called whenever it is ready to be picked up [phone]

## 2021-06-05 NOTE — Telephone Encounter (Signed)
Patient scheduled with Melissa for Tuesday at 4:40 , due to getting off work at 3:30 form placed on your desk.

## 2021-06-10 ENCOUNTER — Ambulatory Visit: Payer: BC Managed Care – PPO | Admitting: Family

## 2021-06-10 ENCOUNTER — Encounter: Payer: Self-pay | Admitting: Family

## 2021-06-10 ENCOUNTER — Other Ambulatory Visit: Payer: Self-pay

## 2021-06-10 VITALS — BP 117/69 | HR 70 | Temp 98.5°F | Resp 16 | Ht 64.0 in | Wt 213.0 lb

## 2021-06-10 DIAGNOSIS — Z0000 Encounter for general adult medical examination without abnormal findings: Secondary | ICD-10-CM | POA: Diagnosis not present

## 2021-06-10 DIAGNOSIS — Z0184 Encounter for antibody response examination: Secondary | ICD-10-CM

## 2021-06-10 DIAGNOSIS — F419 Anxiety disorder, unspecified: Secondary | ICD-10-CM

## 2021-06-10 DIAGNOSIS — Z111 Encounter for screening for respiratory tuberculosis: Secondary | ICD-10-CM

## 2021-06-10 MED ORDER — HYDROXYZINE PAMOATE 25 MG PO CAPS: 25.0000 mg | ORAL_CAPSULE | Freq: Three times a day (TID) | ORAL | 0 refills | Status: DC | PRN

## 2021-06-10 NOTE — Patient Instructions (Signed)
Please complete lab work prior to leaving. You may use hydroxyzine as needed for anxiety.

## 2021-06-11 ENCOUNTER — Ambulatory Visit: Payer: BC Managed Care – PPO | Admitting: Medical

## 2021-06-12 DIAGNOSIS — Z0000 Encounter for general adult medical examination without abnormal findings: Secondary | ICD-10-CM | POA: Insufficient documentation

## 2021-06-12 DIAGNOSIS — F419 Anxiety disorder, unspecified: Secondary | ICD-10-CM | POA: Insufficient documentation

## 2021-06-12 NOTE — Assessment & Plan Note (Signed)
Uncontrolled.  Will give trial of prn atarax.

## 2021-06-12 NOTE — Progress Notes (Signed)
Subjective:    Patient ID: Joanna Banks, female    DOB: [birth year], 35 y.o.   MRN: [id number]  HPI  Patient presents today for complete physical.  Immunizations:  tdap 2019 and pfizer x 3.  Diet: healthy Wt Readings from Last 3 Encounters:  06/10/21 213 lb (96.6 kg)  05/28/21 210 lb (95.3 kg)  03/27/21 214 lb (97.1 kg)   She has lost 60 pounds in the last 1 year.  She cut down on carbs.   Exercise: 4-5 days a week, burn [employer] Pap Smear: GYN Vision: up to date Dental: up to date  Anxiety- notes that she was previously on lexapro.  She tapered off as it seemed to blunt all emotion. Interested in something PRN.   Review of Systems  Constitutional:  Negative for unexpected weight change.  HENT:  Negative for hearing loss and rhinorrhea.   Eyes:  Negative for visual disturbance.  Respiratory:  Negative for cough.   Cardiovascular:  Negative for leg swelling.  Gastrointestinal:  Negative for diarrhea, nausea and vomiting.  Genitourinary:  Negative for dysuria, frequency and hematuria.  Musculoskeletal:  Negative for arthralgias and myalgias.  Skin:  Negative for rash.  Neurological:  Positive for headaches (notes some tension headaches).  Hematological:  Negative for adenopathy.  Psychiatric/Behavioral:         Denies depression/anxiety   Past Medical History:  Diagnosis Date   Anemia    Anxiety    History of gallstones    Obesity    PCOS (polycystic ovarian syndrome)    UTI (urinary tract infection)      Social History   Socioeconomic History   Marital status: Married    Spouse name: Not on file   Number of children: Not on file   Years of education: Not on file   Highest education level: Not on file  Occupational History   Occupation: Teacher  Tobacco Use   Smoking status: Former    Types: Cigarettes   Smokeless tobacco: Never  Vaping Use   Vaping Use: Never used  Substance and Sexual Activity   Alcohol use: No   Drug use: No   Sexual activity:  Yes    Birth control/protection: Condom  Other Topics Concern   Not on file  Social History Narrative   Not on file   Social Determinants of Health   Financial Resource Strain: Not on file  Food Insecurity: Not on file  Transportation Needs: Not on file  Physical Activity: Not on file  Stress: Not on file  Social Connections: Not on file  Intimate Partner Violence: Not on file    Past Surgical History:  Procedure Laterality Date   CESAREAN SECTION  2012   CHOLECYSTECTOMY  11/2019   [facility]   WISDOM TOOTH EXTRACTION      Family History  Problem Relation Age of Onset   Breast cancer Maternal Aunt    Lung cancer Maternal Grandfather    Lung cancer Paternal Grandfather    Diabetes Mother    Diabetes Father    Heart disease Father    Colon polyps Half-Sister    Colon cancer Neg Hx    Esophageal cancer Neg Hx    Rectal cancer Neg Hx    Stomach cancer Neg Hx     Allergies  Allergen Reactions   Sulfa Antibiotics Hives    Current Outpatient Medications on File Prior to Visit  Medication Sig Dispense Refill   Iron-FA-B Cmp-C-Biot-Probiotic (FUSION PLUS) CAPS  Take 1 capsule by mouth daily. 30 capsule 11   metFORMIN (GLUCOPHAGE) 500 MG tablet Take 1 tablet (500 mg total) by mouth 2 (two) times daily with a meal. 180 tablet 2   No current facility-administered medications on file prior to visit.    BP 117/69 (BP Location: Right Arm, Patient Position: Sitting, Cuff Size: Small)   Pulse 70   Temp 98.5 F (36.9 C) (Oral)   Resp 16   Ht 5\' 4"  (1.626 m)   Wt 213 lb (96.6 kg)   SpO2 100%   BMI 36.56 kg/m       Objective:   Physical Exam  Physical Exam  Constitutional: She is oriented to person, place, and time. She appears well-developed and well-nourished. No distress.  HENT:  Head: Normocephalic and atraumatic.  Right Ear: Tympanic membrane and ear canal normal.  Left Ear: Tympanic membrane and ear canal normal.  Mouth/Throat: not examined-  pt wearing mask.  Eyes: Pupils are equal, round, and reactive to light. No scleral icterus.  Neck: Normal range of motion. No thyromegaly present.  Cardiovascular: Normal rate and regular rhythm.   No murmur heard. Pulmonary/Chest: Effort normal and breath sounds normal. No respiratory distress. He has no wheezes. She has no rales. She exhibits no tenderness.  Abdominal: Soft. Bowel sounds are normal. She exhibits no distension and no mass. There is no tenderness. There is no rebound and no guarding.  Musculoskeletal: She exhibits no edema.  Lymphadenopathy:    She has no cervical adenopathy.  Neurological: She is alert and oriented to person, place, and time. She has normal patellar reflexes. She exhibits normal muscle tone. Coordination normal.  Skin: Skin is warm and dry.  Psychiatric: She has a normal mood and affect. Her behavior is normal. Judgment and thought content normal.  Breast/pelvis: deferred to gyn           Assessment & Plan:   Pt has form for school and will need tb screening/titers as ordered.       Assessment & Plan:

## 2021-06-12 NOTE — Assessment & Plan Note (Signed)
Discussed healthy diet/exercise. Commended her on her weight loss.  Pap per gyn. Recommended covid booster this fall when new booster becomes available.

## 2021-06-13 ENCOUNTER — Telehealth: Payer: Self-pay | Admitting: Family

## 2021-06-13 NOTE — Telephone Encounter (Addendum)
TB gold testing is pending.   However, her titers show that she needs an MMR booster.  Please schedule a nurse visit for MMR booster.  She can pick up her paperwork when she comes for her shot.

## 2021-06-13 NOTE — Telephone Encounter (Signed)
Patient advised of results and scheduled to come in 06-18-21 for MMR booster and Hep B#1 of 3.

## 2021-06-14 LAB — MEASLES/MUMPS/RUBELLA IMMUNITY
Mumps IgG: <9 AU/mL — ABNORMAL LOW
Rubella: 1.37 Index
Rubeola IgG: 85.5 AU/mL

## 2021-06-14 LAB — HEPATITIS B SURFACE ANTIBODY, QUANTITATIVE: Hep B S AB Quant (Post): 20 m[IU]/mL (ref >=10)

## 2021-06-14 LAB — QUANTIFERON-TB GOLD PLUS
Mitogen-NIL: >10 IU/mL
NIL: 0.02 IU/mL
QuantiFERON-TB Gold Plus: NEGATIVE
TB1-NIL: 0.04 IU/mL
TB2-NIL: 0.05 IU/mL

## 2021-06-16 ENCOUNTER — Telehealth: Payer: Self-pay | Admitting: Family

## 2021-06-16 NOTE — Telephone Encounter (Signed)
Left detail message about this note from provider, patient has appointment tomorrow for MMR

## 2021-06-16 NOTE — Telephone Encounter (Signed)
TB gold testing is negative.    I reviewed her labs again - she does not need the Hep B shot- my apologies.  Just the MMR booster.

## 2021-06-18 ENCOUNTER — Ambulatory Visit (INDEPENDENT_AMBULATORY_CARE_PROVIDER_SITE_OTHER): Payer: BC Managed Care – PPO | Admitting: *Deleted

## 2021-06-18 ENCOUNTER — Other Ambulatory Visit: Payer: Self-pay

## 2021-06-18 DIAGNOSIS — Z23 Encounter for immunization: Secondary | ICD-10-CM

## 2021-06-18 NOTE — Progress Notes (Signed)
Patient here for MMR booster per Debbrah Alar, NP.     MMR given in left arm and patient tolerated well.

## 2021-07-05 ENCOUNTER — Other Ambulatory Visit: Payer: Self-pay | Admitting: Obstetrics and Gynecology

## 2021-08-26 ENCOUNTER — Other Ambulatory Visit: Payer: Self-pay

## 2021-08-26 ENCOUNTER — Inpatient Hospital Stay: Payer: BC Managed Care – PPO | Attending: Family

## 2021-08-26 DIAGNOSIS — D500 Iron deficiency anemia secondary to blood loss (chronic): Secondary | ICD-10-CM | POA: Diagnosis not present

## 2021-08-26 DIAGNOSIS — N920 Excessive and frequent menstruation with regular cycle: Secondary | ICD-10-CM | POA: Diagnosis present

## 2021-08-26 LAB — CBC WITH DIFFERENTIAL (CANCER CENTER ONLY)
Abs Immature Granulocytes: 0.05 10*3/uL (ref 0.00–0.07)
Basophils Absolute: 0.1 10*3/uL (ref 0.0–0.1)
Basophils Relative: 1 %
Eosinophils Absolute: 0.1 10*3/uL (ref 0.0–0.5)
Eosinophils Relative: 1 %
HCT: 40.6 % (ref 36.0–46.0)
Hemoglobin: 13.5 g/dL (ref 12.0–15.0)
Immature Granulocytes: 1 %
Lymphocytes Relative: 28 %
Lymphs Abs: 3.1 10*3/uL (ref 0.7–4.0)
MCH: 26.7 pg (ref 26.0–34.0)
MCHC: 33.3 g/dL (ref 30.0–36.0)
MCV: 80.4 fL (ref 80.0–100.0)
Monocytes Absolute: 0.6 10*3/uL (ref 0.1–1.0)
Monocytes Relative: 6 %
Neutro Abs: 7.1 10*3/uL (ref 1.7–7.7)
Neutrophils Relative %: 63 %
Platelet Count: 397 10*3/uL (ref 150–400)
RBC: 5.05 MIL/uL (ref 3.87–5.11)
RDW: 15.3 % (ref 11.5–15.5)
WBC Count: 10.9 10*3/uL — ABNORMAL HIGH (ref 4.0–10.5)
nRBC: 0 % (ref 0.0–0.2)

## 2021-08-26 LAB — IRON AND TIBC
Iron: 47 ug/dL (ref 41–142)
Saturation Ratios: 11 % — ABNORMAL LOW (ref 21–57)
TIBC: 425 ug/dL (ref 236–444)
UIBC: 378 ug/dL (ref 120–384)

## 2021-08-26 LAB — RETICULOCYTES
Immature Retic Fract: 9.2 % (ref 2.3–15.9)
RBC.: 5.06 MIL/uL (ref 3.87–5.11)
Retic Count, Absolute: 44.5 10*3/uL (ref 19.0–186.0)
Retic Ct Pct: 0.9 % (ref 0.4–3.1)

## 2021-08-26 LAB — FERRITIN: Ferritin: 5 ng/mL — ABNORMAL LOW (ref 11–307)

## 2021-08-27 ENCOUNTER — Telehealth: Payer: Self-pay | Admitting: *Deleted

## 2021-08-27 NOTE — Telephone Encounter (Signed)
Per scheduling message [name] - called and lvm to schedule (2) doses of IV Iron - requested call back 

## 2021-08-28 ENCOUNTER — Other Ambulatory Visit: Payer: BC Managed Care – PPO

## 2021-08-28 ENCOUNTER — Telehealth: Payer: Self-pay | Admitting: *Deleted

## 2021-08-28 NOTE — Telephone Encounter (Signed)
Per scheduling message [name] - (2) doses of IV Iron - called and gave upcoming appointments - confirmed 

## 2021-09-03 ENCOUNTER — Inpatient Hospital Stay: Payer: BC Managed Care – PPO

## 2021-09-03 ENCOUNTER — Other Ambulatory Visit: Payer: Self-pay

## 2021-09-03 VITALS — BP 104/51 | HR 77 | Temp 98.5°F | Resp 16

## 2021-09-03 DIAGNOSIS — D500 Iron deficiency anemia secondary to blood loss (chronic): Secondary | ICD-10-CM

## 2021-09-03 MED ORDER — SODIUM CHLORIDE 0.9 % IV SOLN: Freq: Once | INTRAVENOUS | Status: AC

## 2021-09-03 MED ORDER — SODIUM CHLORIDE 0.9 % IV SOLN
125.0000 mg | Freq: Once | INTRAVENOUS | Status: AC
  Administered 2021-09-03: 125 mg via INTRAVENOUS
  Filled 2021-09-03: qty 10

## 2021-09-03 NOTE — Patient Instructions (Signed)
Sodium Ferric Gluconate Complex Injection ?What is this medication? ?SODIUM FERRIC GLUCONATE COMPLEX (SOE dee um FER ik GLOO koe nate KOM pleks) treats low levels of iron (iron deficiency anemia) in people with kidney disease. Iron is a mineral that plays an important role in making red blood cells, which carry oxygen from your lungs to the rest of your body. ?This medicine may be used for other purposes; ask your health care provider or pharmacist if you have questions. ?COMMON BRAND NAME(S): Ferrlecit, Nulecit ?What should I tell my care team before I take this medication? ?They need to know if you have any of the following conditions: ?Anemia that is not from iron deficiency ?High levels of iron in the blood ?An unusual or allergic reaction to iron, other medications, foods, dyes, or preservatives ?Pregnant or are trying to become pregnant ?Breast-feeding ?How should I use this medication? ?This medication is injected into a vein. It is given by your care team in a hospital or clinic setting. ?Talk to your care team about the use of this medication in children. While it may be prescribed for children as young as 6 years for selected conditions, precautions do apply. ?Overdosage: If you think you have taken too much of this medicine contact a poison control center or emergency room at once. ?NOTE: This medicine is only for you. Do not share this medicine with others. ?What if I miss a dose? ?It is important not to miss your dose. Call your care team if you are unable to keep an appointment. ?What may interact with this medication? ?Do not take this medication with any of the following: ?Deferasirox ?Deferoxamine ?Dimercaprol ?This medication may also interact with the following: ?Other iron products ?This list may not describe all possible interactions. Give your health care provider a list of all the medicines, herbs, non-prescription drugs, or dietary supplements you use. Also tell them if you smoke, drink  alcohol, or use illegal drugs. Some items may interact with your medicine. ?What should I watch for while using this medication? ?Your condition will be monitored carefully while you are receiving this medication. ?Visit your care team for regular checks on your progress. You may need blood work while you are taking this medication. ?What side effects may I notice from receiving this medication? ?Side effects that you should report to your care team as soon as possible: ?Allergic reactions--skin rash, itching, hives, swelling of the face, lips, tongue, or throat ?Low blood pressure--dizziness, feeling faint or lightheaded, blurry vision ?Shortness of breath ?Side effects that usually do not require medical attention (report to your care team if they continue or are bothersome): ?Flushing ?Headache ?Joint pain ?Muscle pain ?Nausea ?Pain, redness, or irritation at injection site ?This list may not describe all possible side effects. Call your doctor for medical advice about side effects. You may report side effects to FDA at 1-800-FDA-1088. ?Where should I keep my medication? ?This medication is given in a hospital or clinic and will not be stored at home. ?NOTE: This sheet is a summary. It may not cover all possible information. If you have questions about this medicine, talk to your doctor, pharmacist, or health care provider. ?? 2022 Elsevier/Gold Standard (2021-02-28 00:00:00) ? ?

## 2021-09-05 ENCOUNTER — Ambulatory Visit: Payer: BC Managed Care – PPO

## 2021-09-10 ENCOUNTER — Inpatient Hospital Stay: Payer: BC Managed Care – PPO

## 2021-09-10 ENCOUNTER — Other Ambulatory Visit: Payer: Self-pay

## 2021-09-10 VITALS — BP 120/64 | HR 75 | Temp 98.0°F | Resp 17

## 2021-09-10 DIAGNOSIS — D500 Iron deficiency anemia secondary to blood loss (chronic): Secondary | ICD-10-CM

## 2021-09-10 MED ORDER — SODIUM CHLORIDE 0.9 % IV SOLN: Freq: Once | INTRAVENOUS | Status: AC

## 2021-09-10 MED ORDER — SODIUM CHLORIDE 0.9 % IV SOLN
125.0000 mg | Freq: Once | INTRAVENOUS | Status: AC
  Administered 2021-09-10: 125 mg via INTRAVENOUS
  Filled 2021-09-10: qty 125

## 2021-09-10 NOTE — Patient Instructions (Signed)
Sodium Ferric Gluconate Complex Injection ?What is this medication? ?SODIUM FERRIC GLUCONATE COMPLEX (SOE dee um FER ik GLOO koe nate KOM pleks) treats low levels of iron (iron deficiency anemia) in people with kidney disease. Iron is a mineral that plays an important role in making red blood cells, which carry oxygen from your lungs to the rest of your body. ?This medicine may be used for other purposes; ask your health care provider or pharmacist if you have questions. ?COMMON BRAND NAME(S): Ferrlecit, Nulecit ?What should I tell my care team before I take this medication? ?They need to know if you have any of the following conditions: ?Anemia that is not from iron deficiency ?High levels of iron in the blood ?An unusual or allergic reaction to iron, other medications, foods, dyes, or preservatives ?Pregnant or are trying to become pregnant ?Breast-feeding ?How should I use this medication? ?This medication is injected into a vein. It is given by your care team in a hospital or clinic setting. ?Talk to your care team about the use of this medication in children. While it may be prescribed for children as young as 6 years for selected conditions, precautions do apply. ?Overdosage: If you think you have taken too much of this medicine contact a poison control center or emergency room at once. ?NOTE: This medicine is only for you. Do not share this medicine with others. ?What if I miss a dose? ?It is important not to miss your dose. Call your care team if you are unable to keep an appointment. ?What may interact with this medication? ?Do not take this medication with any of the following: ?Deferasirox ?Deferoxamine ?Dimercaprol ?This medication may also interact with the following: ?Other iron products ?This list may not describe all possible interactions. Give your health care provider a list of all the medicines, herbs, non-prescription drugs, or dietary supplements you use. Also tell them if you smoke, drink  alcohol, or use illegal drugs. Some items may interact with your medicine. ?What should I watch for while using this medication? ?Your condition will be monitored carefully while you are receiving this medication. ?Visit your care team for regular checks on your progress. You may need blood work while you are taking this medication. ?What side effects may I notice from receiving this medication? ?Side effects that you should report to your care team as soon as possible: ?Allergic reactions--skin rash, itching, hives, swelling of the face, lips, tongue, or throat ?Low blood pressure--dizziness, feeling faint or lightheaded, blurry vision ?Shortness of breath ?Side effects that usually do not require medical attention (report to your care team if they continue or are bothersome): ?Flushing ?Headache ?Joint pain ?Muscle pain ?Nausea ?Pain, redness, or irritation at injection site ?This list may not describe all possible side effects. Call your doctor for medical advice about side effects. You may report side effects to FDA at 1-800-FDA-1088. ?Where should I keep my medication? ?This medication is given in a hospital or clinic and will not be stored at home. ?NOTE: This sheet is a summary. It may not cover all possible information. If you have questions about this medicine, talk to your doctor, pharmacist, or health care provider. ?? 2022 Elsevier/Gold Standard (2021-02-28 00:00:00) ? ?

## 2021-09-10 NOTE — Progress Notes (Signed)
Pt declined to stay for post infusion observation period. Pt stated she has tolerated medication multiple times prior without difficulty. Pt aware to call clinic with any questions or concerns. Pt verbalized understanding and had no further questions.  ? ?

## 2021-09-17 ENCOUNTER — Other Ambulatory Visit: Payer: Self-pay | Admitting: Family

## 2021-09-23 ENCOUNTER — Telehealth: Payer: Self-pay | Admitting: *Deleted

## 2021-09-23 ENCOUNTER — Telehealth: Payer: Self-pay | Admitting: Family

## 2021-09-23 ENCOUNTER — Telehealth: Payer: BC Managed Care – PPO | Admitting: Family Medicine

## 2021-09-23 ENCOUNTER — Other Ambulatory Visit: Payer: Self-pay | Admitting: Family

## 2021-09-23 DIAGNOSIS — R6889 Other general symptoms and signs: Secondary | ICD-10-CM

## 2021-09-23 MED ORDER — OSELTAMIVIR PHOSPHATE 75 MG PO CAPS
75.0000 mg | ORAL_CAPSULE | Freq: Two times a day (BID) | ORAL | 0 refills | Status: AC
Start: 2021-09-23 — End: 2021-09-28

## 2021-09-23 NOTE — Progress Notes (Signed)
E visit for Flu like symptoms   We are sorry that you are not feeling well.  Here is how we plan to help! Based on what you have shared with me it looks like you may have possible exposure to a virus that causes influenza.  Influenza or "the flu" is   an infection caused by a respiratory virus. The flu virus is highly contagious and persons who did not receive their yearly flu vaccination may "catch" the flu from close contact.  We have anti-viral medications to treat the viruses that cause this infection. They are not a "cure" and only shorten the course of the infection. These prescriptions are most effective when they are given within the first 2 days of "flu" symptoms. Antiviral medication are indicated if you have a high risk of complications from the flu. You should  also consider an antiviral medication if you are in close contact with someone who is at risk. These medications can help patients avoid complications from the flu  but have side effects that you should know. Possible side effects from Tamiflu or oseltamivir include nausea, vomiting, diarrhea, dizziness, headaches, eye redness, sleep problems or other respiratory symptoms. You should not take Tamiflu if you have an allergy to oseltamivir or any to the ingredients in Tamiflu.  Based upon your symptoms and potential risk factors I have prescribed Oseltamivir (Tamiflu).  It has been sent to your designated pharmacy.  You will take one 75 mg capsule orally twice a day for the next 5 days.  ANYONE WHO HAS FLU SYMPTOMS SHOULD: Stay home. The flu is highly contagious and going out or to work exposes others! Be sure to drink plenty of fluids. Water is fine as well as fruit juices, sodas and electrolyte beverages. You may want to stay away from caffeine or alcohol. If you are nauseated, try taking small sips of liquids. How do you know if you are getting enough fluid? Your urine should be a pale yellow or almost colorless. Get rest. Taking  a steamy shower or using a humidifier may help nasal congestion and ease sore throat pain. Using a saline nasal spray works much the same way. Cough drops, hard candies and sore throat lozenges may ease your cough. Line up a caregiver. Have someone check on you regularly.   GET HELP RIGHT AWAY IF: You cannot keep down liquids or your medications. You become short of breath Your fell like you are going to pass out or loose consciousness. Your symptoms persist after you have completed your treatment plan MAKE SURE YOU  Understand these instructions. Will watch your condition. Will get help right away if you are not doing well or get worse.  Your e-visit answers were reviewed by a board certified advanced clinical practitioner to complete your personal care plan.  Depending on the condition, your plan could have included both over the counter or prescription medications.  If there is a problem please reply  once you have received a response from your provider.  Your safety is important to us.  If you have drug allergies check your prescription carefully.    You can use MyChart to ask questions about today's visit, request a non-urgent call back, or ask for a work or school excuse for 24 hours related to this e-Visit. If it has been greater than 24 hours you will need to follow up with your provider, or enter a new e-Visit to address those concerns.  You will get an e-mail in the   next two days asking about your experience.  I hope that your e-visit has been valuable and will speed your recovery. Thank you for using e-visits.    I provided 5 minutes of non face-to-face time during this encounter for chart review, medication and order placement, as well as and documentation.   

## 2021-09-23 NOTE — Telephone Encounter (Signed)
This nurse called patient and informed her about her iron levels. Per Vicente Masson, she needs to be scheduled for three more doses of IV iron. I left message on her phone and instructed her to call back if she had any questions or concerns.

## 2021-09-24 ENCOUNTER — Ambulatory Visit: Payer: BC Managed Care – PPO

## 2021-11-12 ENCOUNTER — Telehealth: Payer: Self-pay | Admitting: *Deleted

## 2021-11-12 NOTE — Telephone Encounter (Signed)
Called patient and lvm to rescheduled appointment due to Joanna Banks being out - requested call back to confirm.

## 2021-11-22 IMAGING — US US ABDOMEN COMPLETE
1 series · 13 of 25 positions shown · non-contrast
Comparison: None.

CLINICAL DATA: Abdominal pain, nausea and vomiting.

EXAM:
ABDOMEN ULTRASOUND COMPLETE

[Series 1: us abdomen complete · 13 of 55 slices shown]
[im 1/55]
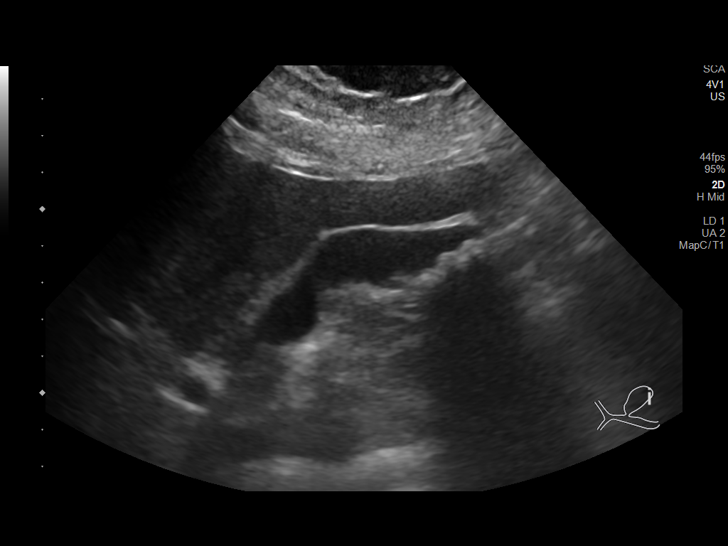
[im 5/55]
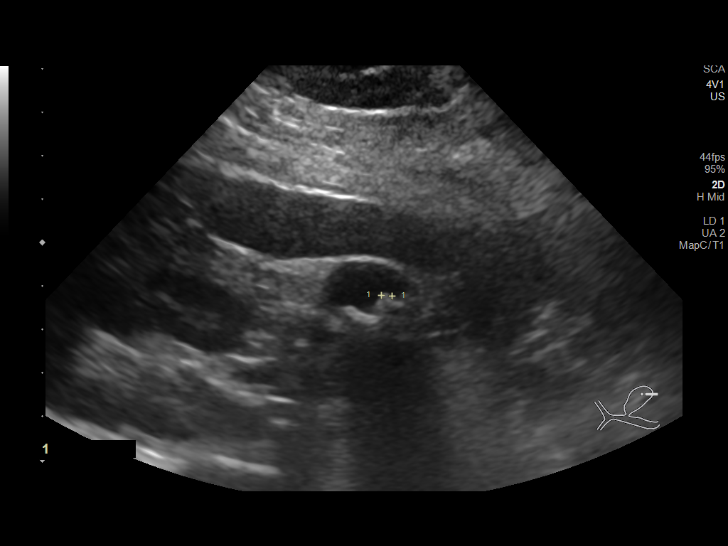
[im 10/55]
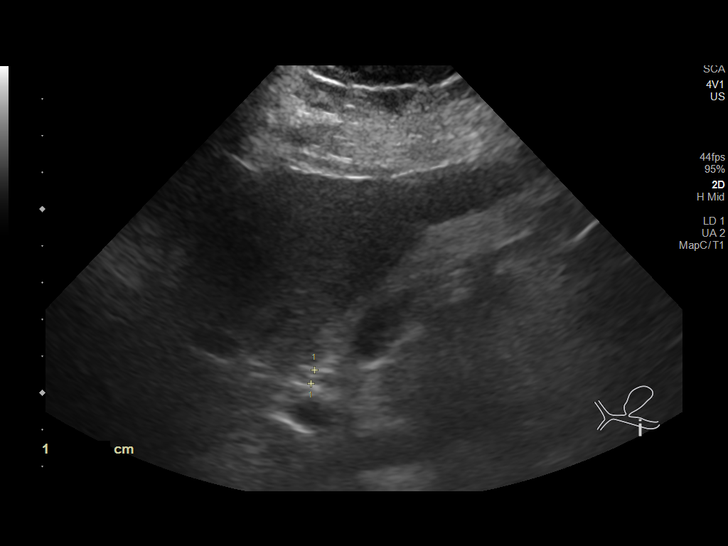
[im 14/55]
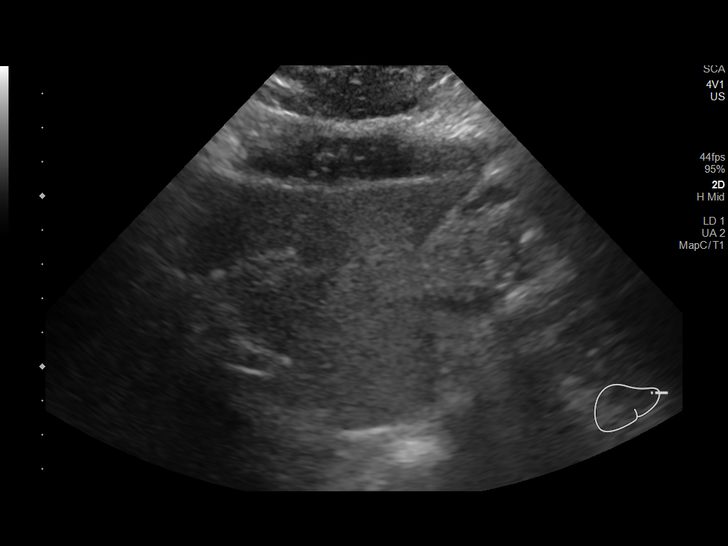
[im 19/55]
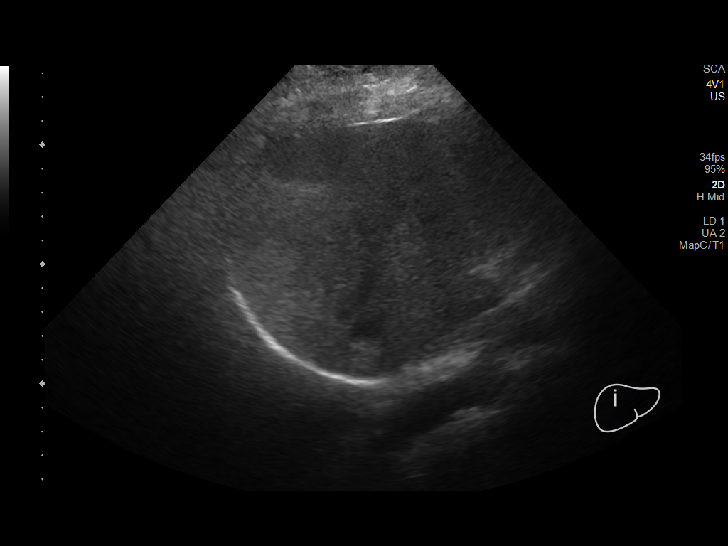
[im 23/55]
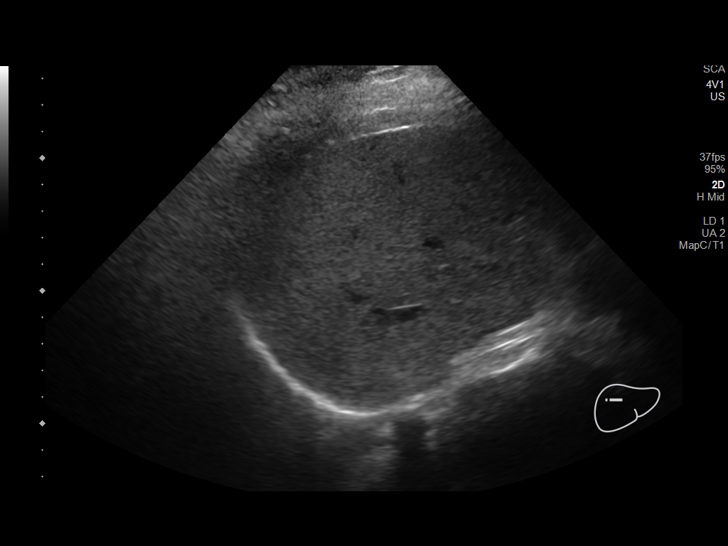
[im 28/55]
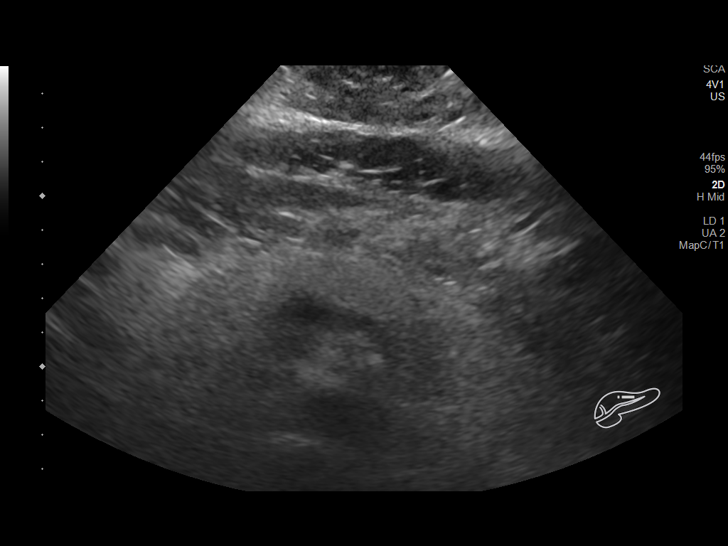
[im 32/55]
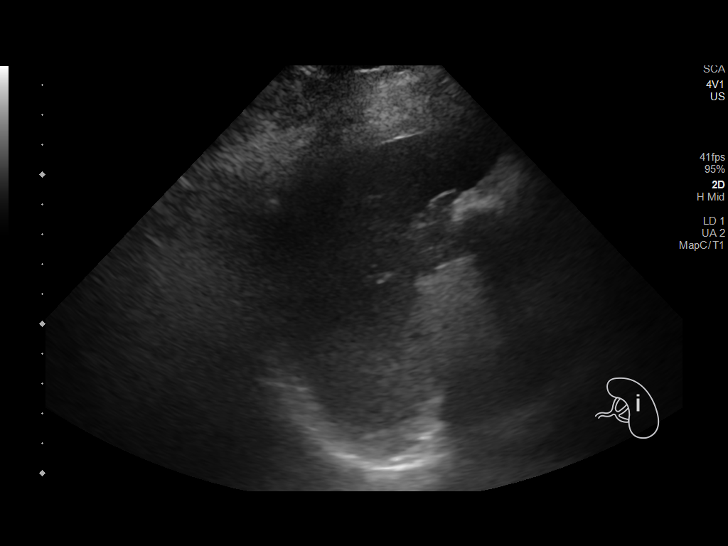
[im 37/55]
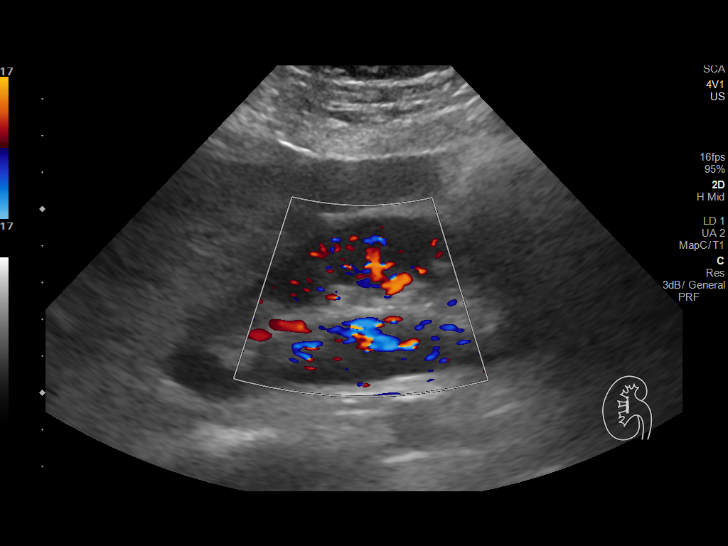
[im 41/55]
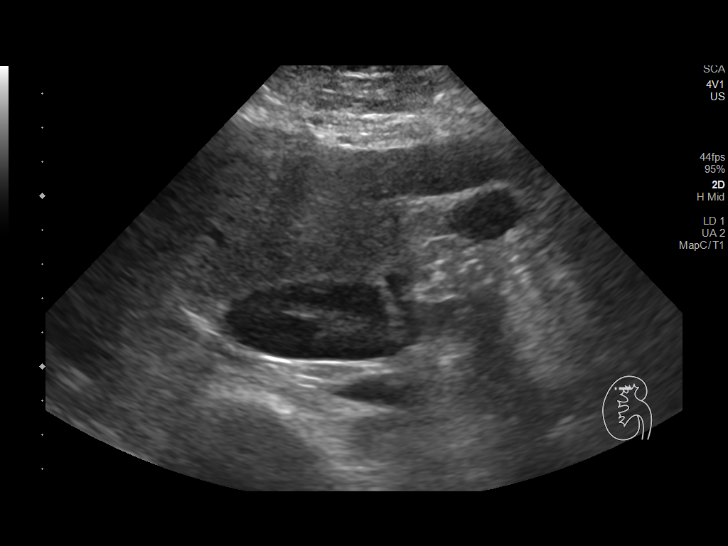
[im 46/55]
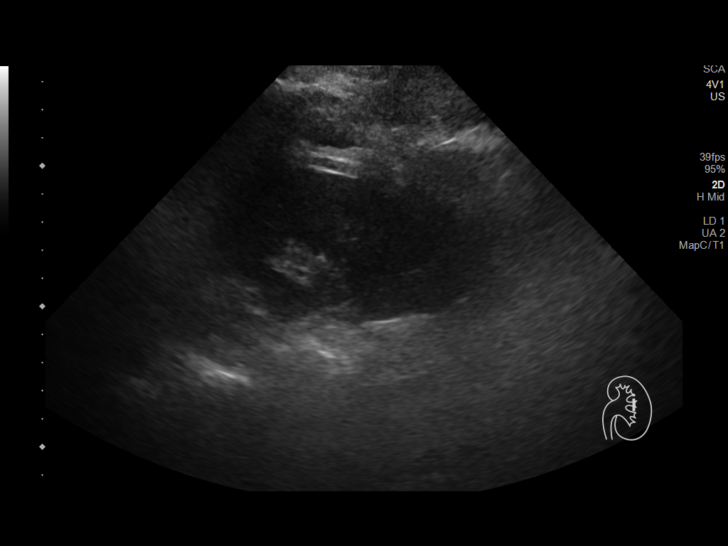
[im 50/55]
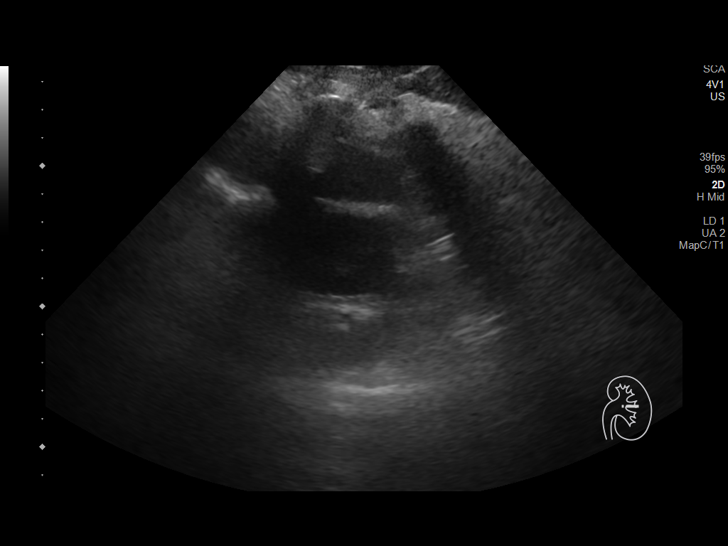
[im 55/55]
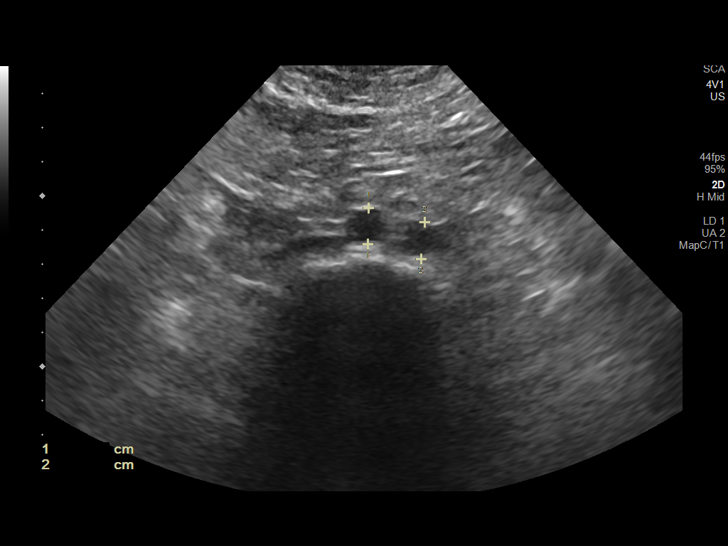

[13 of 25 positions shown; findings below may reference images not displayed]

FINDINGS: Gallbladder: Echogenic shadowing gallstones are noted in the
gallbladder. The largest measures 7.8 mm. The gallbladder is mildly
contracted. No definite gallbladder wall thickening or
pericholecystic fluid. Negative sonographic Murphy sign.

Common bile duct: Diameter: 3.8 mm

Liver: Heterogeneous areas of increased echogenicity suggesting
geographic fatty infiltration. No focal hepatic lesions or
intrahepatic biliary dilatation. Portal vein is patent on color
Doppler imaging with normal direction of blood flow towards the
liver.

IVC: Normal caliber

Pancreas: Sonographically unremarkable.

Spleen: Normal size.  No focal lesions.

Right Kidney: Length: 11.0 cm. Normal renal cortical thickness and
echogenicity without focal lesions or hydronephrosis.

Left Kidney: Length: 13.9 cm. Normal renal cortical thickness and
echogenicity without focal lesions or hydronephrosis.

Abdominal aorta: Normal caliber

Other findings: No ascites
IMPRESSION: 1. Cholelithiasis without definite sonographic findings for acute
cholecystitis.
2. Normal caliber common bile duct.
3. Suspect geographic fatty infiltration of the liver.
4. Normal pancreas, spleen and kidneys.

## 2021-11-28 ENCOUNTER — Ambulatory Visit: Payer: BC Managed Care – PPO | Admitting: Family

## 2021-11-28 ENCOUNTER — Other Ambulatory Visit: Payer: BC Managed Care – PPO

## 2021-12-08 ENCOUNTER — Ambulatory Visit: Payer: BC Managed Care – PPO | Admitting: Family

## 2021-12-08 ENCOUNTER — Other Ambulatory Visit: Payer: BC Managed Care – PPO

## 2021-12-09 ENCOUNTER — Ambulatory Visit: Payer: BC Managed Care – PPO | Admitting: Family

## 2021-12-09 ENCOUNTER — Other Ambulatory Visit: Payer: BC Managed Care – PPO

## 2021-12-10 ENCOUNTER — Inpatient Hospital Stay: Payer: BC Managed Care – PPO | Admitting: Family

## 2021-12-10 ENCOUNTER — Inpatient Hospital Stay: Payer: BC Managed Care – PPO

## 2021-12-16 ENCOUNTER — Encounter: Payer: Self-pay | Admitting: Family

## 2021-12-16 ENCOUNTER — Other Ambulatory Visit: Payer: Self-pay

## 2021-12-16 ENCOUNTER — Inpatient Hospital Stay: Payer: BC Managed Care – PPO | Attending: Hematology & Oncology

## 2021-12-16 ENCOUNTER — Inpatient Hospital Stay: Payer: BC Managed Care – PPO | Admitting: Family

## 2021-12-16 VITALS — BP 119/83 | HR 74 | Temp 99.1°F | Resp 17 | Wt 217.1 lb

## 2021-12-16 DIAGNOSIS — D500 Iron deficiency anemia secondary to blood loss (chronic): Secondary | ICD-10-CM | POA: Insufficient documentation

## 2021-12-16 DIAGNOSIS — N920 Excessive and frequent menstruation with regular cycle: Secondary | ICD-10-CM | POA: Diagnosis present

## 2021-12-16 LAB — CBC WITH DIFFERENTIAL (CANCER CENTER ONLY)
Abs Immature Granulocytes: 0.13 10*3/uL — ABNORMAL HIGH (ref 0.00–0.07)
Basophils Absolute: 0.1 10*3/uL (ref 0.0–0.1)
Basophils Relative: 0 %
Eosinophils Absolute: 0.1 10*3/uL (ref 0.0–0.5)
Eosinophils Relative: 1 %
HCT: 38.8 % (ref 36.0–46.0)
Hemoglobin: 12.7 g/dL (ref 12.0–15.0)
Immature Granulocytes: 1 %
Lymphocytes Relative: 24 %
Lymphs Abs: 3 10*3/uL (ref 0.7–4.0)
MCH: 27 pg (ref 26.0–34.0)
MCHC: 32.7 g/dL (ref 30.0–36.0)
MCV: 82.6 fL (ref 80.0–100.0)
Monocytes Absolute: 0.6 10*3/uL (ref 0.1–1.0)
Monocytes Relative: 5 %
Neutro Abs: 8.7 10*3/uL — ABNORMAL HIGH (ref 1.7–7.7)
Neutrophils Relative %: 69 %
Platelet Count: 438 10*3/uL — ABNORMAL HIGH (ref 150–400)
RBC: 4.7 MIL/uL (ref 3.87–5.11)
RDW: 13.5 % (ref 11.5–15.5)
WBC Count: 12.6 10*3/uL — ABNORMAL HIGH (ref 4.0–10.5)
nRBC: 0 % (ref 0.0–0.2)

## 2021-12-16 LAB — RETICULOCYTES
Immature Retic Fract: 9.2 % (ref 2.3–15.9)
RBC.: 4.58 MIL/uL (ref 3.87–5.11)
Retic Count, Absolute: 49.9 10*3/uL (ref 19.0–186.0)
Retic Ct Pct: 1.1 % (ref 0.4–3.1)

## 2021-12-16 MED ORDER — FUSION PLUS PO CAPS: 1.0000 | ORAL_CAPSULE | Freq: Every day | ORAL | 11 refills | Status: DC

## 2021-12-16 NOTE — Progress Notes (Signed)
Hematology and Oncology Follow Up Visit  Joanna Banks [id number] 03-23-1986 36 y.o. 12/16/2021   Principle Diagnosis:  Iron deficiency anemia    Current Therapy:        Fusion plus oral iron supplement daily IV iron as indicated   Interim History:  Joanna Banks is here today for follow-up. She is symptomatic with persistent fatigue.  She notes that her cycles is regular with heavy flow for the first day and is only lasting 3-4 days now.  No other blood loss noted.  No bruising or petechiae.  No fever, chills, n/v, cough, rash, dizziness, SOB, chest pain, palpitations, abdominal pain or changes in bowel or bladder habits.  No swelling, tenderness, numbness or tingling in her extremities at this time.  No falls or syncope to report.  Appetite is good and she is doing her best to stay hydrated throughout the day at work. Her weight is 217 lbs.   ECOG Performance Status: 1 - Symptomatic but completely ambulatory  Medications:  Allergies as of 12/16/2021       Reactions   Sulfa Antibiotics Hives        Medication List        Accurate as of December 16, 2021  3:39 PM. If you have any questions, ask your nurse or doctor.          Fusion Plus Caps Take 1 capsule by mouth daily.   hydrOXYzine 25 MG capsule Commonly known as: VISTARIL Take 1 capsule (25 mg total) by mouth every 8 (eight) hours as needed for anxiety.   metFORMIN 500 MG tablet Commonly known as: GLUCOPHAGE TAKE 1 TABLET BY MOUTH 2 TIMES DAILY WITH A MEAL.        Allergies:  Allergies  Allergen Reactions   Sulfa Antibiotics Hives    Past Medical History, Surgical history, Social history, and Family History were reviewed and updated.  Review of Systems: All other 10 point review of systems is negative.   Physical Exam:  vitals were not taken for this visit.   Wt Readings from Last 3 Encounters:  06/10/21 213 lb (96.6 kg)  05/28/21 210 lb (95.3 kg)  03/27/21 214 lb (97.1 kg)    Ocular:  Sclerae unicteric, pupils equal, round and reactive to light Ear-nose-throat: Oropharynx clear, dentition fair Lymphatic: No cervical or supraclavicular adenopathy Lungs no rales or rhonchi, good excursion bilaterally Heart regular rate and rhythm, no murmur appreciated Abd soft, nontender, positive bowel sounds MSK no focal spinal tenderness, no joint edema Neuro: non-focal, well-oriented, appropriate affect Breasts: Deferred   Lab Results  Component Value Date   WBC 10.9 (H) 08/26/2021   HGB 13.5 08/26/2021   HCT 40.6 08/26/2021   MCV 80.4 08/26/2021   PLT 397 08/26/2021   Lab Results  Component Value Date   FERRITIN 5 (L) 08/26/2021   IRON 47 08/26/2021   TIBC 425 08/26/2021   UIBC 378 08/26/2021   IRONPCTSAT 11 (L) 08/26/2021   Lab Results  Component Value Date   RETICCTPCT 0.9 08/26/2021   RBC 5.06 08/26/2021   No results found for: KPAFRELGTCHN, LAMBDASER, KAPLAMBRATIO No results found for: IGGSERUM, IGA, IGMSERUM No results found for: Georgann Housekeeper, MSPIKE, SPEI   Chemistry      Component Value Date/Time   NA 136 01/15/2021 1442   K 3.4 (L) 01/15/2021 1442   CL 100 01/15/2021 1442   CO2 26 01/15/2021 1442   BUN 7 01/15/2021 1442   CREATININE 0.75  01/15/2021 1442   CREATININE 0.62 02/27/2019 1345      Component Value Date/Time   CALCIUM 9.3 01/15/2021 1442   ALKPHOS 77 01/15/2021 1442   AST 27 01/15/2021 1442   ALT 23 01/15/2021 1442   BILITOT 0.4 01/15/2021 1442       Impression and Plan: Joanna Banks is a very pleasant 36 yo caucasian female with history of iron deficiency anemia due to heavy cycles and GI blood loss.  Iron studies are pending.  Lab check in 3 months, follow-up in 6 months.   Eileen Stanford, NP 2/28/20233:39 PM

## 2021-12-17 ENCOUNTER — Telehealth: Payer: Self-pay | Admitting: Family

## 2021-12-17 ENCOUNTER — Other Ambulatory Visit: Payer: Self-pay | Admitting: Family

## 2021-12-17 LAB — FERRITIN: Ferritin: 4 ng/mL — ABNORMAL LOW (ref 11–307)

## 2021-12-17 LAB — IRON AND IRON BINDING CAPACITY (CC-WL,HP ONLY)
Iron: 47 ug/dL (ref 28–170)
Saturation Ratios: 10 % — ABNORMAL LOW (ref 10.4–31.8)
TIBC: 484 ug/dL — ABNORMAL HIGH (ref 250–450)
UIBC: 437 ug/dL (ref 148–442)

## 2021-12-17 NOTE — Telephone Encounter (Signed)
Called to schedule 2 doses of iron per 3/1 sch msg, left voicemail ?

## 2021-12-18 ENCOUNTER — Telehealth: Payer: Self-pay | Admitting: *Deleted

## 2021-12-18 ENCOUNTER — Encounter: Payer: Self-pay | Admitting: *Deleted

## 2021-12-18 NOTE — Telephone Encounter (Signed)
Per scheduling message [name] - called and gave upcoming appointments - (2) doses of IV Iron - confirmed 

## 2021-12-25 ENCOUNTER — Inpatient Hospital Stay: Payer: BC Managed Care – PPO | Attending: Hematology & Oncology

## 2021-12-25 ENCOUNTER — Other Ambulatory Visit: Payer: Self-pay

## 2021-12-25 VITALS — BP 123/68 | HR 71 | Temp 98.6°F | Resp 18

## 2021-12-25 DIAGNOSIS — N920 Excessive and frequent menstruation with regular cycle: Secondary | ICD-10-CM | POA: Insufficient documentation

## 2021-12-25 DIAGNOSIS — D500 Iron deficiency anemia secondary to blood loss (chronic): Secondary | ICD-10-CM | POA: Diagnosis not present

## 2021-12-25 MED ORDER — SODIUM CHLORIDE 0.9 % IV SOLN: Freq: Once | INTRAVENOUS | Status: AC

## 2021-12-25 MED ORDER — SODIUM CHLORIDE 0.9 % IV SOLN
300.0000 mg | Freq: Once | INTRAVENOUS | Status: AC
  Administered 2021-12-25: 15:00:00 300 mg via INTRAVENOUS
  Filled 2021-12-25: qty 300

## 2021-12-25 NOTE — Patient Instructions (Signed)

## 2021-12-26 ENCOUNTER — Ambulatory Visit (INDEPENDENT_AMBULATORY_CARE_PROVIDER_SITE_OTHER): Payer: BC Managed Care – PPO | Admitting: Medical

## 2021-12-26 VITALS — BP 130/70 | HR 77 | Temp 98.2°F | Resp 18 | Ht 64.0 in | Wt 214.0 lb

## 2021-12-26 DIAGNOSIS — H6122 Impacted cerumen, left ear: Secondary | ICD-10-CM

## 2021-12-26 NOTE — Progress Notes (Signed)
? ?Subjective:  ? ? Patient ID: Joanna Banks, female    DOB: 1985/12/13, 36 y.o.   MRN: [id number] ? ?HPI ? ?Pt in feeling left ear blocked up. Pt states muffled sounds. She tried debox.  ?She had uri type symptoms about 10 days ago. Those sins/symptoms resolved and now only plugged sensation. ? ?No ear pain. No sinus pressure.  ? ?Pt had ears lavage out before. Pt gave verbal consent for lavage. After lavage will see if currette needed. ? ? ? ?Review of Systems  ?HENT:  Negative for congestion, ear pain, hearing loss, nosebleeds, postnasal drip and rhinorrhea.   ?Respiratory:  Negative for cough, chest tightness, shortness of breath and wheezing.   ?Cardiovascular:  Negative for chest pain.  ?Endocrine: Negative for polydipsia and polyuria.  ?Genitourinary:  Negative for dysuria, flank pain and frequency.  ?Neurological:  Negative for dizziness and headaches.  ?Hematological:  Negative for adenopathy. Does not bruise/bleed easily.  ?Psychiatric/Behavioral:  Negative for decreased concentration.   ? ? ?Past Medical History:  ?Diagnosis Date  ? Anemia   ? Anxiety   ? History of gallstones   ? Obesity   ? PCOS (polycystic ovarian syndrome)   ? UTI (urinary tract infection)   ? ?  ?Social History  ? ?Socioeconomic History  ? Marital status: Married  ?  Spouse name: Not on file  ? Number of children: Not on file  ? Years of education: Not on file  ? Highest education level: Not on file  ?Occupational History  ? Occupation: Runner, broadcasting/film/video  ?Tobacco Use  ? Smoking status: Former  ?  Types: Cigarettes  ? Smokeless tobacco: Never  ?Vaping Use  ? Vaping Use: Never used  ?Substance and Sexual Activity  ? Alcohol use: No  ? Drug use: No  ? Sexual activity: Yes  ?  Birth control/protection: Condom  ?Other Topics Concern  ? Not on file  ?Social History Narrative  ? Not on file  ? ?Social Determinants of Health  ? ?Financial Resource Strain: Not on file  ?Food Insecurity: Not on file  ?Transportation Needs: Not on file  ?Physical  Activity: Not on file  ?Stress: Not on file  ?Social Connections: Not on file  ?Intimate Partner Violence: Not on file  ? ? ?Past Surgical History:  ?Procedure Laterality Date  ? CESAREAN SECTION  2012  ? CHOLECYSTECTOMY  11/2019  ? [facility]  ? WISDOM TOOTH EXTRACTION    ? ? ?Family History  ?Problem Relation Age of Onset  ? Breast cancer Maternal Aunt   ? Lung cancer Maternal Grandfather   ? Lung cancer Paternal Grandfather   ? Diabetes Mother   ? Diabetes Father   ? Heart disease Father   ? Colon polyps Half-Sister   ? Colon cancer Neg Hx   ? Esophageal cancer Neg Hx   ? Rectal cancer Neg Hx   ? Stomach cancer Neg Hx   ? ? ?Allergies  ?Allergen Reactions  ? Sulfa Antibiotics Hives  ? ? ?Current Outpatient Medications on File Prior to Visit  ?Medication Sig Dispense Refill  ? Iron-FA-B Cmp-C-Biot-Probiotic (FUSION PLUS) CAPS Take 1 capsule by mouth daily. 30 capsule 11  ? metFORMIN (GLUCOPHAGE) 500 MG tablet TAKE 1 TABLET BY MOUTH 2 TIMES DAILY WITH A MEAL. 180 tablet 2  ? ?No current facility-administered medications on file prior to visit.  ? ? ?BP 130/70   Pulse 77   Temp 98.2 ?F (36.8 ?C)   Resp 18  Ht 5\' 4"  (1.626 m)   Wt 214 lb (97.1 kg)   SpO2 100%   BMI 36.73 kg/m?  ?  ?   ?Objective:  ? Physical Exam ? ?General- No acute distress. Pleasant patient. ?Neck- Full range of motion, no jvd ?Lungs- Clear, even and unlabored. ?Heart- regular rate and rhythm. ?Neurologic- CNII- XII grossly intact.  ? ?Heent- no frontal or sinus pressure. Left ear- canal filled blocking view of tm. No portion of tm seen. ? ? ?   ?Assessment & Plan:  ? ?Patient Instructions  ?Cerumen impaction. Verbal consent give for lavage procedure and explained in some cases may need to use currette if not all wax removed by lavage.  ? ?MA cleared wax out completely with no complications. Currette not needed. ? ?Follow up as regularly scheduled or sooner if needed  ? ? , PA-C  ?

## 2021-12-26 NOTE — Patient Instructions (Signed)
Cerumen impaction. Verbal consent give for lavage procedure and explained in some cases may need to use currette if not all wax removed by lavage.  ? ?MA cleared wax out completely with no complications. Currette not needed. ? ?Follow up as regularly scheduled or sooner if needed ?

## 2021-12-28 ENCOUNTER — Emergency Department
Admission: EM | Admit: 2021-12-28 | Discharge: 2021-12-28 | Disposition: A | Discharge status: Home / Self Care | Payer: BC Managed Care – PPO | Source: Home / Self Care

## 2021-12-28 ENCOUNTER — Other Ambulatory Visit: Payer: Self-pay

## 2021-12-28 ENCOUNTER — Emergency Department: Admit: 2021-12-28 | Payer: Self-pay

## 2021-12-28 DIAGNOSIS — J020 Streptococcal pharyngitis: Secondary | ICD-10-CM | POA: Diagnosis not present

## 2021-12-28 DIAGNOSIS — R509 Fever, unspecified: Secondary | ICD-10-CM | POA: Diagnosis not present

## 2021-12-28 DIAGNOSIS — J0100 Acute maxillary sinusitis, unspecified: Secondary | ICD-10-CM

## 2021-12-28 LAB — POCT RAPID STREP A (OFFICE): Rapid Strep A Screen: POSITIVE — AB

## 2021-12-28 LAB — POCT INFLUENZA A/B
Influenza A, POC: NEGATIVE
Influenza B, POC: NEGATIVE

## 2021-12-28 LAB — POC SARS CORONAVIRUS 2 AG -  ED: SARS Coronavirus 2 Ag: NEGATIVE

## 2021-12-28 MED ORDER — PREDNISONE 20 MG PO TABS: ORAL_TABLET | ORAL | 0 refills | Status: DC

## 2021-12-28 MED ORDER — AMOXICILLIN-POT CLAVULANATE 875-125 MG PO TABS: 1.0000 | ORAL_TABLET | Freq: Two times a day (BID) | ORAL | 0 refills | Status: AC

## 2021-12-28 NOTE — Discharge Instructions (Addendum)
Instructed patient to take medication as directed with food to completion.  Advised patient to take prednisone with first dose of Augmentin for the next 5 of 10 days.  Encouraged patient to increase daily water intake while taking these medications.  Advised patient if symptoms worsen and or unresolved please follow-up with PCP or here for further evaluation. ?

## 2021-12-28 NOTE — ED Provider Notes (Signed)
?[facility] ? ? ? ?CSN: [id number] ?Arrival date & time: 12/28/21  2633 ? ? ?  ? ?History   ?Chief Complaint ?Chief Complaint  ?Patient presents with  ? Fever  ? Sore Throat  ? Otalgia  ? ? ?HPI ?Joanna Banks is a 36 y.o. female.  ? ?HPI 36 year old female presents with sore throat, fever and bilateral ear pain since yesterday.  Reports fever of 101.0 oral this morning at her home.  PMH significant for anemia, PCOS, and obesity. ? ?Past Medical History:  ?Diagnosis Date  ? Anemia   ? Anxiety   ? History of gallstones   ? Obesity   ? PCOS (polycystic ovarian syndrome)   ? UTI (urinary tract infection)   ? ? ?Patient Active Problem List  ? Diagnosis Date Noted  ? Preventative health care 06/12/2021  ? Anxiety 06/12/2021  ? IDA (iron deficiency anemia) 01/15/2021  ? ? ?Past Surgical History:  ?Procedure Laterality Date  ? CESAREAN SECTION  2012  ? CHOLECYSTECTOMY  11/2019  ? [facility]  ? WISDOM TOOTH EXTRACTION    ? ? ?OB History   ? ? Gravida  ?3  ? Para  ?3  ? Term  ?3  ? Preterm  ?   ? AB  ?   ? Living  ?3  ?  ? ? SAB  ?   ? IAB  ?   ? Ectopic  ?   ? Multiple  ?0  ? Live Births  ?1  ?   ?  ?  ? ? ? ?Home Medications   ? ?Prior to Admission medications   ?Medication Sig Start Date End Date Taking? Authorizing Provider  ?acetaminophen (TYLENOL) 500 MG tablet Take 500 mg by mouth every 6 (six) hours as needed.   Yes [provider]  ?amoxicillin-clavulanate (AUGMENTIN) 875-125 MG tablet Take 1 tablet by mouth 2 (two) times daily for 10 days. 12/28/21 01/07/22 Yes Trevor Iha, FNP  ?predniSONE (DELTASONE) 20 MG tablet Take 3 tabs PO daily x 5 days. 12/28/21  Yes Trevor Iha, FNP  ?Iron-FA-B Cmp-C-Biot-Probiotic (FUSION PLUS) CAPS Take 1 capsule by mouth daily. 12/16/21   Erenest Blank, NP  ?metFORMIN (GLUCOPHAGE) 500 MG tablet TAKE 1 TABLET BY MOUTH 2 TIMES DAILY WITH A MEAL. 07/08/21   Reva Bores, MD  ? ? ?Family History ?Family History  ?Problem Relation Age of Onset  ?  Diabetes Mother   ? Diabetes Father   ? Heart disease Father   ? Lung cancer Maternal Grandfather   ? Lung cancer Paternal Grandfather   ? Breast cancer Maternal Aunt   ? Colon polyps Half-Sister   ? Colon cancer Neg Hx   ? Esophageal cancer Neg Hx   ? Rectal cancer Neg Hx   ? Stomach cancer Neg Hx   ? ? ?Social History ?Social History  ? ?Tobacco Use  ? Smoking status: Former  ?  Types: Cigarettes  ? Smokeless tobacco: Never  ?Vaping Use  ? Vaping Use: Never used  ?Substance Use Topics  ? Alcohol use: No  ? Drug use: No  ? ? ? ?Allergies   ?Sulfa antibiotics ? ? ?Review of Systems ?Review of Systems  ?HENT:  Positive for ear pain and sore throat.   ?All other systems reviewed and are negative. ? ? ?Physical Exam ?Triage Vital Signs ?ED Triage Vitals  ?Enc Vitals Group  ?   BP 12/28/21 1038 122/79  ?   Pulse Rate 12/28/21  1038 (!) 106  ?   Resp 12/28/21 1038 20  ?   Temp 12/28/21 1038 99.6 ?F (37.6 ?C)  ?   Temp Source 12/28/21 1038 Oral  ?   SpO2 12/28/21 1038 99 %  ?   Weight 12/28/21 1035 205 lb (93 kg)  ?   Height 12/28/21 1035 5\' 4"  (1.626 m)  ?   Head Circumference --   ?   Peak Flow --   ?   Pain Score 12/28/21 1035 7  ?   Pain Loc --   ?   Pain Edu? --   ?   Excl. in GC? --   ? ?No data found. ? ?Updated Vital Signs ?BP 122/79 (BP Location: Right Arm)   Pulse (!) 106   Temp 99.6 ?F (37.6 ?C) (Oral)   Resp 20   Ht 5\' 4"  (1.626 m)   Wt 205 lb (93 kg)   LMP 12/02/2021 Comment: Uses condoms for birth control  SpO2 99%   BMI 35.19 kg/m?  ? ? ?Physical Exam ?Vitals and nursing note reviewed.  ?Constitutional:   ?   General: She is not in acute distress. ?   Appearance: Normal appearance. She is obese. She is not ill-appearing.  ?HENT:  ?   Head: Normocephalic and atraumatic.  ?   Right Ear: Tympanic membrane, ear canal and external ear normal.  ?   Left Ear: Tympanic membrane, ear canal and external ear normal.  ?   Mouth/Throat:  ?   Mouth: Mucous membranes are moist.  ?   Pharynx: Oropharynx is clear.  Uvula midline. Posterior oropharyngeal erythema and uvula swelling present.  ?Eyes:  ?   Conjunctiva/sclera: Conjunctivae normal.  ?   Pupils: Pupils are equal, round, and reactive to light.  ?Cardiovascular:  ?   Rate and Rhythm: Normal rate and regular rhythm.  ?   Pulses: Normal pulses.  ?   Heart sounds: Normal heart sounds.  ?Pulmonary:  ?   Effort: Pulmonary effort is normal.  ?   Breath sounds: Normal breath sounds. No wheezing, rhonchi or rales.  ?Musculoskeletal:  ?   Cervical back: Normal range of motion and neck supple.  ?Skin: ?   General: Skin is warm and dry.  ?Neurological:  ?   General: No focal deficit present.  ?   Mental Status: She is alert and oriented to person, place, and time.  ? ? ? ?UC Treatments / Results  ?Labs ?(all labs ordered are listed, but only abnormal results are displayed) ?Labs Reviewed  ?POCT RAPID STREP A (OFFICE) - Abnormal; Notable for the following components:  ?    Result Value  ? Rapid Strep A Screen Positive (*)   ? All other components within normal limits  ?POC SARS CORONAVIRUS 2 AG -  ED  ?POCT INFLUENZA A/B  ? ? ?EKG ? ? ?Radiology ?No results found. ? ?Procedures ?Procedures (including critical care time) ? ?Medications Ordered in UC ?Medications - No data to display ? ?Initial Impression / Assessment and Plan / UC Course  ?I have reviewed the triage vital signs and the nursing notes. ? ?Pertinent labs & imaging results that were available during my care of the patient were reviewed by me and considered in my medical decision making (see chart for details). ? ?  ? ?MDM: 1.  Strep pharyngitis-Rx'd Augmentin; 2.  Subacute maxillary sinusitis-Rx Augmentin, prednisone; 3.  Fever -patient reports taking Tylenol 500 mg prior to this office visit at 8 AM  this morning. Instructed patient to take medication as directed with food to completion.  Advised patient to take prednisone with first dose of Augmentin for the next 5 of 10 days.  Encouraged patient to increase daily  water intake while taking these medications.  Advised patient if symptoms worsen and/or unresolved please follow-up with PCP or here for further evaluation.  Work note provided prior to discharge today.  Patient discharged home, hemodynamically stable. ?Final Clinical Impressions(s) / UC Diagnoses  ? ?Final diagnoses:  ?Fever, unspecified  ?Strep pharyngitis  ?Subacute maxillary sinusitis  ? ? ? ?Discharge Instructions   ? ?  ?Instructed patient to take medication as directed with food to completion.  Advised patient to take prednisone with first dose of Augmentin for the next 5 of 10 days.  Encouraged patient to increase daily water intake while taking these medications.  Advised patient if symptoms worsen and/or unresolved please follow-up with PCP or here for further evaluation. ? ? ? ? ?ED Prescriptions   ? ? Medication Sig Dispense Auth. Provider  ? amoxicillin-clavulanate (AUGMENTIN) 875-125 MG tablet Take 1 tablet by mouth 2 (two) times daily for 10 days. 20 tablet Trevor Iha, FNP  ? predniSONE (DELTASONE) 20 MG tablet Take 3 tabs PO daily x 5 days. 15 tablet Trevor Iha, FNP  ? ?  ? ?PDMP not reviewed this encounter. ?  ?Trevor Iha, FNP ?12/28/21 1201 ? ?

## 2021-12-28 NOTE — ED Triage Notes (Signed)
Pt presents to Urgent Care with c/o sore throat, fever, and bilat ear pain since yesterday. Reports fever of 101 this AM. No COVID test done.  ? ?

## 2021-12-29 ENCOUNTER — Ambulatory Visit: Payer: BC Managed Care – PPO | Admitting: Medical

## 2022-01-01 ENCOUNTER — Other Ambulatory Visit: Payer: Self-pay

## 2022-01-01 ENCOUNTER — Inpatient Hospital Stay: Payer: BC Managed Care – PPO

## 2022-01-01 VITALS — BP 121/64 | HR 62 | Temp 98.2°F

## 2022-01-01 DIAGNOSIS — D500 Iron deficiency anemia secondary to blood loss (chronic): Secondary | ICD-10-CM | POA: Diagnosis not present

## 2022-01-01 MED ORDER — SODIUM CHLORIDE 0.9 % IV SOLN: Freq: Once | INTRAVENOUS | Status: AC

## 2022-01-01 MED ORDER — SODIUM CHLORIDE 0.9 % IV SOLN
300.0000 mg | Freq: Once | INTRAVENOUS | Status: AC
  Administered 2022-01-01: 300 mg via INTRAVENOUS
  Filled 2022-01-01: qty 300

## 2022-01-01 NOTE — Patient Instructions (Signed)

## 2022-01-05 ENCOUNTER — Encounter: Payer: Self-pay | Admitting: Medical

## 2022-01-08 ENCOUNTER — Ambulatory Visit: Payer: BC Managed Care – PPO | Admitting: Medical

## 2022-03-17 ENCOUNTER — Inpatient Hospital Stay: Payer: BC Managed Care – PPO | Attending: Hematology & Oncology

## 2022-03-17 DIAGNOSIS — N920 Excessive and frequent menstruation with regular cycle: Secondary | ICD-10-CM | POA: Diagnosis present

## 2022-03-17 DIAGNOSIS — D500 Iron deficiency anemia secondary to blood loss (chronic): Secondary | ICD-10-CM | POA: Insufficient documentation

## 2022-03-17 LAB — CBC WITH DIFFERENTIAL (CANCER CENTER ONLY)
Abs Immature Granulocytes: 0.08 10*3/uL — ABNORMAL HIGH (ref 0.00–0.07)
Basophils Absolute: 0.1 10*3/uL (ref 0.0–0.1)
Basophils Relative: 0 %
Eosinophils Absolute: 0.2 10*3/uL (ref 0.0–0.5)
Eosinophils Relative: 1 %
HCT: 39.5 % (ref 36.0–46.0)
Hemoglobin: 13.1 g/dL (ref 12.0–15.0)
Immature Granulocytes: 1 %
Lymphocytes Relative: 27 %
Lymphs Abs: 3.3 10*3/uL (ref 0.7–4.0)
MCH: 28.1 pg (ref 26.0–34.0)
MCHC: 33.2 g/dL (ref 30.0–36.0)
MCV: 84.6 fL (ref 80.0–100.0)
Monocytes Absolute: 0.6 10*3/uL (ref 0.1–1.0)
Monocytes Relative: 5 %
Neutro Abs: 7.8 10*3/uL — ABNORMAL HIGH (ref 1.7–7.7)
Neutrophils Relative %: 66 %
Platelet Count: 396 10*3/uL (ref 150–400)
RBC: 4.67 MIL/uL (ref 3.87–5.11)
RDW: 14.6 % (ref 11.5–15.5)
WBC Count: 11.9 10*3/uL — ABNORMAL HIGH (ref 4.0–10.5)
nRBC: 0 % (ref 0.0–0.2)

## 2022-03-17 LAB — FERRITIN: Ferritin: 42 ng/mL (ref 11–307)

## 2022-03-17 LAB — RETICULOCYTES
Immature Retic Fract: 7.5 % (ref 2.3–15.9)
RBC.: 4.64 MIL/uL (ref 3.87–5.11)
Retic Count, Absolute: 55.7 10*3/uL (ref 19.0–186.0)
Retic Ct Pct: 1.2 % (ref 0.4–3.1)

## 2022-03-18 LAB — IRON AND IRON BINDING CAPACITY (CC-WL,HP ONLY)
Iron: 52 ug/dL (ref 28–170)
Saturation Ratios: 15 % (ref 10.4–31.8)
TIBC: 347 ug/dL (ref 250–450)
UIBC: 295 ug/dL (ref 148–442)

## 2022-03-30 ENCOUNTER — Ambulatory Visit (INDEPENDENT_AMBULATORY_CARE_PROVIDER_SITE_OTHER): Payer: BC Managed Care – PPO | Admitting: Medical

## 2022-03-30 ENCOUNTER — Encounter: Payer: Self-pay | Admitting: Medical

## 2022-03-30 VITALS — BP 136/84 | HR 79 | Temp 98.3°F | Resp 16 | Ht 64.0 in | Wt 222.6 lb

## 2022-03-30 DIAGNOSIS — Z0000 Encounter for general adult medical examination without abnormal findings: Secondary | ICD-10-CM | POA: Diagnosis not present

## 2022-03-30 DIAGNOSIS — E669 Obesity, unspecified: Secondary | ICD-10-CM | POA: Diagnosis not present

## 2022-03-30 LAB — COMPREHENSIVE METABOLIC PANEL
ALT: 19 U/L (ref 0–35)
AST: 16 U/L (ref 0–37)
Albumin: 4.4 g/dL (ref 3.5–5.2)
Alkaline Phosphatase: 55 U/L (ref 39–117)
BUN: 9 mg/dL (ref 6–23)
CO2: 29 mEq/L (ref 19–32)
Calcium: 9.8 mg/dL (ref 8.4–10.5)
Chloride: 102 mEq/L (ref 96–112)
Creatinine, Ser: 0.65 mg/dL (ref 0.40–1.20)
GFR: 113.27 mL/min (ref >60.00)
Glucose, Bld: 71 mg/dL (ref 70–99)
Potassium: 4.8 mEq/L (ref 3.5–5.1)
Sodium: 139 mEq/L (ref 135–145)
Total Bilirubin: 0.4 mg/dL (ref 0.2–1.2)
Total Protein: 7.1 g/dL (ref 6.0–8.3)

## 2022-03-30 LAB — CBC WITH DIFFERENTIAL/PLATELET
Basophils Absolute: 0 10*3/uL (ref 0.0–0.1)
Basophils Relative: 0.5 % (ref 0.0–3.0)
Eosinophils Absolute: 0.1 10*3/uL (ref 0.0–0.7)
Eosinophils Relative: 0.8 % (ref 0.0–5.0)
HCT: 41.1 % (ref 36.0–46.0)
Hemoglobin: 13.3 g/dL (ref 12.0–15.0)
Lymphocytes Relative: 31.1 % (ref 12.0–46.0)
Lymphs Abs: 2.4 10*3/uL (ref 0.7–4.0)
MCHC: 32.3 g/dL (ref 30.0–36.0)
MCV: 86.9 fl (ref 78.0–100.0)
Monocytes Absolute: 0.5 10*3/uL (ref 0.1–1.0)
Monocytes Relative: 6.2 % (ref 3.0–12.0)
Neutro Abs: 4.8 10*3/uL (ref 1.4–7.7)
Neutrophils Relative %: 61.4 % (ref 43.0–77.0)
Platelets: 363 10*3/uL (ref 150.0–400.0)
RBC: 4.73 Mil/uL (ref 3.87–5.11)
RDW: 14.3 % (ref 11.5–15.5)
WBC: 7.7 10*3/uL (ref 4.0–10.5)

## 2022-03-30 LAB — LIPID PANEL
Cholesterol: 166 mg/dL (ref 0–200)
HDL: 58.3 mg/dL (ref >39.00)
LDL Cholesterol: 87 mg/dL (ref 0–99)
NonHDL: 107.85
Total CHOL/HDL Ratio: 3
Triglycerides: 106 mg/dL (ref 0.0–149.0)
VLDL: 21.2 mg/dL (ref 0.0–40.0)

## 2022-03-30 NOTE — Patient Instructions (Addendum)
For you wellness exam today I have ordered cbc, cmp and  lipid panel.  Vaccine up to date.  Recommend exercise and healthy diet.  We will let you know lab results as they come in.  Follow up date appointment will be determined after lab review.    Ask you call your gyn office to make sure up to date on papsmear.  For obesity with bmi 38.21 can try to prescribe wegovy. Some contraindication are if you have personal or  family history of thyromedullary cancer or mutliple endocrine neoplasia. Or if personal history of pancreatitis. Please be aware your insurance may not cover but let me know if you want me to prescribe. Let me know later by mychart or call.  Preventive Care 30-79 Years Old, Female Preventive care refers to lifestyle choices and visits with your health care provider that can promote health and wellness. Preventive care visits are also called wellness exams. What can I expect for my preventive care visit? Counseling During your preventive care visit, your health care provider may ask about your: Medical history, including: Past medical problems. Family medical history. Pregnancy history. Current health, including: Menstrual cycle. Method of birth control. Emotional well-being. Home life and relationship well-being. Sexual activity and sexual health. Lifestyle, including: Alcohol, nicotine or tobacco, and drug use. Access to firearms. Diet, exercise, and sleep habits. Work and work Statistician. Sunscreen use. Safety issues such as seatbelt and bike helmet use. Physical exam Your health care provider may check your: Height and weight. These may be used to calculate your BMI (body mass index). BMI is a measurement that tells if you are at a healthy weight. Waist circumference. This measures the distance around your waistline. This measurement also tells if you are at a healthy weight and may help predict your risk of certain diseases, such as type 2 diabetes and high  blood pressure. Heart rate and blood pressure. Body temperature. Skin for abnormal spots. What immunizations do I need?  Vaccines are usually given at various ages, according to a schedule. Your health care provider will recommend vaccines for you based on your age, medical history, and lifestyle or other factors, such as travel or where you work. What tests do I need? Screening Your health care provider may recommend screening tests for certain conditions. This may include: Pelvic exam and Pap test. Lipid and cholesterol levels. Diabetes screening. This is done by checking your blood sugar (glucose) after you have not eaten for a while (fasting). Hepatitis B test. Hepatitis C test. HIV (human immunodeficiency virus) test. STI (sexually transmitted infection) testing, if you are at risk. BRCA-related cancer screening. This may be done if you have a family history of breast, ovarian, tubal, or peritoneal cancers. Talk with your health care provider about your test results, treatment options, and if necessary, the need for more tests. Follow these instructions at home: Eating and drinking  Eat a healthy diet that includes fresh fruits and vegetables, whole grains, lean protein, and low-fat dairy products. Take vitamin and mineral supplements as recommended by your health care provider. Do not drink alcohol if: Your health care provider tells you not to drink. You are pregnant, may be pregnant, or are planning to become pregnant. If you drink alcohol: Limit how much you have to 0-1 drink a day. Know how much alcohol is in your drink. In the [place] one drink equals one 12 oz bottle of beer (355 mL), one 5 oz glass of wine (148 mL), or one 1 oz  glass of hard liquor (44 mL). Lifestyle Brush your teeth every morning and night with fluoride toothpaste. Floss one time each day. Exercise for at least 30 minutes 5 or more days each week. Do not use any products that contain nicotine or tobacco.  These products include cigarettes, chewing tobacco, and vaping devices, such as e-cigarettes. If you need help quitting, ask your health care provider. Do not use drugs. If you are sexually active, practice safe sex. Use a condom or other form of protection to prevent STIs. If you do not wish to become pregnant, use a form of birth control. If you plan to become pregnant, see your health care provider for a prepregnancy visit. Find healthy ways to manage stress, such as: Meditation, yoga, or listening to music. Journaling. Talking to a trusted person. Spending time with friends and family. Minimize exposure to UV radiation to reduce your risk of skin cancer. Safety Always wear your seat belt while driving or riding in a vehicle. Do not drive: If you have been drinking alcohol. Do not ride with someone who has been drinking. If you have been using any mind-altering substances or drugs. While texting. When you are tired or distracted. Wear a helmet and other protective equipment during sports activities. If you have firearms in your house, make sure you follow all gun safety procedures. Seek help if you have been physically or sexually abused. What's next? Go to your health care provider once a year for an annual wellness visit. Ask your health care provider how often you should have your eyes and teeth checked. Stay up to date on all vaccines. This information is not intended to replace advice given to you by your health care provider. Make sure you discuss any questions you have with your health care provider. Document Revised: 04/02/2021 Document Reviewed: 04/02/2021 Elsevier Patient Education  [place].

## 2022-03-30 NOTE — Progress Notes (Addendum)
Subjective:    Patient ID: Joanna Banks, female    DOB: [birth year], 36 y.o.   MRN: [id number]  HPI Pt in for wellness exam  Work is going well. Pt states off work for about 2 months. Pt exercising 3-4 days a week. Pt states healthy diet during the day. After dinner tender to snack. Pt has 3 children. Non smoker and no alcohol use.  Pt states last pap? Maybe 4 yeas ago.  Pt on metformin and iron. Has pcos. Normal iron level 2 weeks ago.  Pt also sent message today wanting to know if  candidate for [facility] to help manage weight loss?  Pt went to a clinic where phentermine was prescribed but I wasn't comfortable taking it.  Pt has read that [facility] can help curb cravings and allow  body adjust to correct portion control without feeling the cravings.  Interested in trying it since my BMI and health history meet the requirements for this medication.   Review of Systems  Respiratory:  Negative for cough, chest tightness, shortness of breath and wheezing.   Cardiovascular:  Negative for chest pain and palpitations.  Gastrointestinal:  Negative for abdominal pain.  Genitourinary:  Negative for dysuria.  Musculoskeletal:  Negative for back pain and joint swelling.  Neurological:  Negative for facial asymmetry and light-headedness.  Hematological:  Negative for adenopathy. Does not bruise/bleed easily.  Psychiatric/Behavioral:  Negative for behavioral problems and confusion. The patient is not nervous/anxious and is not hyperactive.    Past Medical History:  Diagnosis Date   Anemia    Anxiety    History of gallstones    Obesity    PCOS (polycystic ovarian syndrome)    UTI (urinary tract infection)      Social History   Socioeconomic History   Marital status: Married    Spouse name: Not on file   Number of children: Not on file   Years of education: Not on file   Highest education level: Not on file  Occupational History   Occupation: Teacher  Tobacco Use   Smoking status: Former     Types: Cigarettes   Smokeless tobacco: Never  Vaping Use   Vaping Use: Never used  Substance and Sexual Activity   Alcohol use: No   Drug use: No   Sexual activity: Not on file  Other Topics Concern   Not on file  Social History Narrative   Not on file   Social Determinants of Health   Financial Resource Strain: Not on file  Food Insecurity: Not on file  Transportation Needs: Not on file  Physical Activity: Not on file  Stress: Not on file  Social Connections: Not on file  Intimate Partner Violence: Not on file    Past Surgical History:  Procedure Laterality Date   CESAREAN SECTION  2012   CHOLECYSTECTOMY  11/2019   [facility]   WISDOM TOOTH EXTRACTION      Family History  Problem Relation Age of Onset   Diabetes Mother    Diabetes Father    Heart disease Father    Lung cancer Maternal Grandfather    Lung cancer Paternal Grandfather    Breast cancer Maternal Aunt    Colon polyps Half-Sister    Colon cancer Neg Hx    Esophageal cancer Neg Hx    Rectal cancer Neg Hx    Stomach cancer Neg Hx     Allergies  Allergen Reactions   Sulfa Antibiotics Hives    Current  Outpatient Medications on File Prior to Visit  Medication Sig Dispense Refill   acetaminophen (TYLENOL) 500 MG tablet Take 500 mg by mouth every 6 (six) hours as needed.     Iron-FA-B Cmp-C-Biot-Probiotic (FUSION PLUS) CAPS Take 1 capsule by mouth daily. 30 capsule 11   metFORMIN (GLUCOPHAGE) 500 MG tablet TAKE 1 TABLET BY MOUTH 2 TIMES DAILY WITH A MEAL. 180 tablet 2   predniSONE (DELTASONE) 20 MG tablet Take 3 tabs PO daily x 5 days. 15 tablet 0   No current facility-administered medications on file prior to visit.    BP 136/84 (BP Location: Left Arm, Patient Position: Sitting, Cuff Size: Normal)   Pulse 79   Temp 98.3 F (36.8 C) (Oral)   Resp 16   Ht 5\' 4"  (1.626 m)   Wt 222 lb 9.6 oz (101 kg)   SpO2 100%   BMI 38.21 kg/m        Objective:   Physical  Exam  General Mental Status- Alert. General Appearance- Not in acute distress.   Skin General: Color- Normal Color. Moisture- Normal Moisture.  Neck Carotid Arteries- Normal color. Moisture- Normal Moisture. No carotid bruits. No JVD.  Chest and Lung Exam Auscultation: Breath Sounds:-Normal.  Cardiovascular Auscultation:Rythm- Regular. Murmurs & Other Heart Sounds:Auscultation of the heart reveals- No Murmurs.  Abdomen Inspection:-Inspeection Normal. Palpation/Percussion:Note:No mass. Palpation and Percussion of the abdomen reveal- Non Tender, Non Distended + BS, no rebound or guarding.   Neurologic Cranial Nerve exam:- CN III-XII intact(No nystagmus), symmetric smile. Strength:- 5/5 equal and symmetric strength both upper and lower extremities.       Assessment & Plan:  For you wellness exam today I have ordered cbc, cmp and  lipid panel.  Vaccine up to date.  Recommend exercise and healthy diet.  We will let you know lab results as they come in.  Follow up date appointment will be determined after lab review.    Ask you call your gyn office to make sure up to date on papsmear.  For obesity with bmi 38.21 can try to prescribe wegovy. Some contraindication are if you have personal or  family history of thyromedullary canceror mutliple endocrine neoplasia. Or if personal history of pancreatitis. Please be aware your insurance may not cover but let me know if you want me to prescribe. Let me know later by mychart or call.  , Esperanza Richters    [place] as did address obesity and wt loss today. Documented in note as insurance might want more information/prior authorization.

## 2022-04-01 ENCOUNTER — Telehealth: Payer: Self-pay

## 2022-04-01 MED ORDER — SEMAGLUTIDE-WEIGHT MANAGEMENT 0.25 MG/0.5ML ~~LOC~~ SOAJ: 0.2500 mg | SUBCUTANEOUS | 0 refills | Status: DC

## 2022-04-01 NOTE — Telephone Encounter (Signed)
Joanna Banks (Joanna Banks) Rx #: [id number] 0.25MG /0.5ML auto-injectors  Started , awaiting determination

## 2022-04-01 NOTE — Addendum Note (Signed)
Addended by: Gwenevere Abbot on: 04/01/2022 08:46 AM   Modules accepted: Orders

## 2022-04-02 ENCOUNTER — Encounter: Payer: Self-pay | Admitting: Family

## 2022-04-02 NOTE — Telephone Encounter (Signed)
As long as you remain covered by the [facility] and there are no changes to your plan benefits, this request is approved for the following time period: 04/02/2022 - 11/02/2022

## 2022-04-20 ENCOUNTER — Telehealth: Payer: Self-pay | Admitting: *Deleted

## 2022-04-20 NOTE — Telephone Encounter (Signed)
Left patient a message that I will MyChart message her with the first available appointment for appointment request.

## 2022-04-24 NOTE — Progress Notes (Signed)
Last Mammogram: n/a Last Pap Smear:  07/03/19- negative Last Colon Screening;  n/a Seat Belts:   Yes Sun Screen:   Yes Dental Check Up:  Yes Brush & Floss:  Yes

## 2022-04-27 ENCOUNTER — Other Ambulatory Visit (HOSPITAL_COMMUNITY)
Admission: RE | Admit: 2022-04-27 | Discharge: 2022-04-27 | Disposition: A | Discharge status: Home / Self Care | Payer: BC Managed Care – PPO | Source: Ambulatory Visit | Attending: Obstetrics & Gynecology | Admitting: Obstetrics & Gynecology

## 2022-04-27 ENCOUNTER — Encounter: Payer: Self-pay | Admitting: Obstetrics & Gynecology

## 2022-04-27 ENCOUNTER — Ambulatory Visit (INDEPENDENT_AMBULATORY_CARE_PROVIDER_SITE_OTHER): Payer: BC Managed Care – PPO | Admitting: Obstetrics & Gynecology

## 2022-04-27 VITALS — BP 122/80 | HR 74 | Ht 64.0 in | Wt 225.0 lb

## 2022-04-27 DIAGNOSIS — N644 Mastodynia: Secondary | ICD-10-CM | POA: Diagnosis not present

## 2022-04-27 DIAGNOSIS — Z01419 Encounter for gynecological examination (general) (routine) without abnormal findings: Secondary | ICD-10-CM | POA: Diagnosis present

## 2022-04-27 NOTE — Progress Notes (Signed)
Subjective:     Joanna Banks is a 36 y.o. female here for a routine exam.  Current complaints: pain in left outer breast that radiates towards nipple.   Gynecologic History Patient's last menstrual period was 04/24/2022. Contraception: condoms Last Mammogram: n/a Last Pap Smear:  07/03/19- negative Last Colon Screening;  n/a Seat Belts:   Yes Sun Screen:   Yes Dental Check Up:  Yes Brush & Floss:  Yes   Obstetric History OB History  Gravida Para Term Preterm AB Living  3 3 3     3   SAB IAB Ectopic Multiple Live Births        0 1    # Outcome Date GA Lbr Len/2nd Weight Sex Delivery Anes PTL Lv  3 Term 05/13/18 [redacted]w[redacted]d 11:30 / 00:19 9 lb 14 oz (4.479 kg) M VBAC EPI  LIV  2 Term      Vag-Spont     1 Term      CS-LTranv        The following portions of the patient's history were reviewed and updated as appropriate: allergies, current medications, past family history, past medical history, past social history, past surgical history, and problem list.  Review of Systems Pertinent items noted in HPI and remainder of comprehensive ROS otherwise negative.    Objective:     Vitals:   04/27/22 1538  BP: 122/80  Pulse: 74  Weight: 225 lb (102.1 kg)  Height: 5\' 4"  (1.626 m)   Vitals:  WNL General appearance: alert, cooperative and no distress  HEENT: Normocephalic, without obvious abnormality, atraumatic Eyes: negative Throat: lips, mucosa, and tongue normal; teeth and gums normal  Respiratory: Clear to auscultation bilaterally  CV: Regular rate and rhythm  Breasts:  Normal appearance, no masses or tenderness, no nipple retraction or dimpling; some discomfort in left upper outer quadrant; +fibrocystic feeling  GI: Soft, non-tender; bowel sounds normal; no masses,  no organomegaly  GU: External Genitalia:  Tanner V, no lesion Urethra:  No prolapse   Vagina: Pink, normal rugae, no blood or discharge  Cervix: No CMT, no lesion  Uterus:  Normal size and contour, non tender   Adnexa: ? Adnexal mass?  Exam limited by habitus.  Will get 06/28/22.   Musculoskeletal: No edema, redness or tenderness in the calves or thighs  Skin: No lesions or rash  Lymphatic: Axillary adenopathy: none     Psychiatric: Normal mood and behavior        Assessment:    Healthy female exam.  Left breast pain   Plan:    Pap with co testing Left breast diagnostic mammogram and Pelvic US complete for ? Right adnexal mass.

## 2022-04-29 ENCOUNTER — Ambulatory Visit (INDEPENDENT_AMBULATORY_CARE_PROVIDER_SITE_OTHER): Payer: BC Managed Care – PPO

## 2022-04-29 ENCOUNTER — Encounter: Payer: Self-pay | Admitting: Obstetrics & Gynecology

## 2022-04-29 DIAGNOSIS — Z01419 Encounter for gynecological examination (general) (routine) without abnormal findings: Secondary | ICD-10-CM

## 2022-04-30 LAB — CYTOLOGY - PAP
Comment: NEGATIVE
Diagnosis: NEGATIVE
High risk HPV: NEGATIVE

## 2022-05-12 ENCOUNTER — Ambulatory Visit
Admission: RE | Admit: 2022-05-12 | Discharge: 2022-05-12 | Disposition: A | Discharge status: Home / Self Care | Payer: BC Managed Care – PPO | Source: Ambulatory Visit | Attending: Obstetrics & Gynecology | Admitting: Obstetrics & Gynecology

## 2022-05-12 DIAGNOSIS — N644 Mastodynia: Secondary | ICD-10-CM

## 2022-05-22 ENCOUNTER — Telehealth: Payer: Self-pay | Admitting: *Deleted

## 2022-05-22 NOTE — Telephone Encounter (Signed)
Called and lvm of recheduling appointment with Maralyn Sago - requested callback to confirm

## 2022-06-11 ENCOUNTER — Inpatient Hospital Stay: Payer: BC Managed Care – PPO | Attending: Hematology & Oncology

## 2022-06-11 ENCOUNTER — Encounter: Payer: Self-pay | Admitting: Family

## 2022-06-11 ENCOUNTER — Inpatient Hospital Stay: Payer: BC Managed Care – PPO | Admitting: Family

## 2022-06-11 VITALS — BP 118/72 | HR 77 | Temp 98.6°F | Resp 17 | Wt 226.1 lb

## 2022-06-11 DIAGNOSIS — D500 Iron deficiency anemia secondary to blood loss (chronic): Secondary | ICD-10-CM | POA: Insufficient documentation

## 2022-06-11 DIAGNOSIS — N920 Excessive and frequent menstruation with regular cycle: Secondary | ICD-10-CM | POA: Diagnosis present

## 2022-06-11 LAB — IRON AND IRON BINDING CAPACITY (CC-WL,HP ONLY)
Iron: 53 ug/dL (ref 28–170)
Saturation Ratios: 15 % (ref 10.4–31.8)
TIBC: 347 ug/dL (ref 250–450)
UIBC: 294 ug/dL (ref 148–442)

## 2022-06-11 LAB — CBC WITH DIFFERENTIAL (CANCER CENTER ONLY)
Abs Immature Granulocytes: 0.12 10*3/uL — ABNORMAL HIGH (ref 0.00–0.07)
Basophils Absolute: 0 10*3/uL (ref 0.0–0.1)
Basophils Relative: 0 %
Eosinophils Absolute: 0.1 10*3/uL (ref 0.0–0.5)
Eosinophils Relative: 1 %
HCT: 39.9 % (ref 36.0–46.0)
Hemoglobin: 13 g/dL (ref 12.0–15.0)
Immature Granulocytes: 1 %
Lymphocytes Relative: 25 %
Lymphs Abs: 2.3 10*3/uL (ref 0.7–4.0)
MCH: 28 pg (ref 26.0–34.0)
MCHC: 32.6 g/dL (ref 30.0–36.0)
MCV: 86 fL (ref 80.0–100.0)
Monocytes Absolute: 0.7 10*3/uL (ref 0.1–1.0)
Monocytes Relative: 7 %
Neutro Abs: 6.1 10*3/uL (ref 1.7–7.7)
Neutrophils Relative %: 66 %
Platelet Count: 424 10*3/uL — ABNORMAL HIGH (ref 150–400)
RBC: 4.64 MIL/uL (ref 3.87–5.11)
RDW: 13.3 % (ref 11.5–15.5)
WBC Count: 9.4 10*3/uL (ref 4.0–10.5)
nRBC: 0 % (ref 0.0–0.2)

## 2022-06-11 LAB — FERRITIN: Ferritin: 25 ng/mL (ref 11–307)

## 2022-06-11 LAB — RETICULOCYTES
Immature Retic Fract: 8.7 % (ref 2.3–15.9)
RBC.: 4.54 MIL/uL (ref 3.87–5.11)
Retic Count, Absolute: 65.4 10*3/uL (ref 19.0–186.0)
Retic Ct Pct: 1.4 % (ref 0.4–3.1)

## 2022-06-11 NOTE — Progress Notes (Signed)
Hematology and Oncology Follow Up Visit  Joanna Banks [id number] May 31, 1986 36 y.o. 06/11/2022   Principle Diagnosis:  Iron deficiency anemia    Current Therapy:        Fusion plus oral iron supplement daily IV iron as indicated   Interim History:  [name] is here today for follow-up. She is doing well but does note some fatigue. 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  Her cycle is regular only lasting 4 days now and flow seems lighter.  No other blood loss noted. No bruising or petechiae.  No fever, chills, n/v, cough, rash, dizziness, SOB, chest pain, palpitations, abdominal pain or changes in bowel or bladder habits.  No swelling, tenderness, numbness or tingling in her extremities.  No falls or syncope. Appetite and hydration are good. Her weight is stable at 226 lbs.   ECOG Performance Status: 1 - Symptomatic but completely ambulatory  Medications:  Allergies as of 06/11/2022       Reactions   Sulfa Antibiotics Hives        Medication List        Accurate as of June 11, 2022  8:46 AM. If you have any questions, ask your nurse or doctor.          acetaminophen 500 MG tablet Commonly known as: TYLENOL Take 500 mg by mouth every 6 (six) hours as needed.   Fusion Plus Caps Take 1 capsule by mouth daily.   metFORMIN 500 MG tablet Commonly known as: GLUCOPHAGE TAKE 1 TABLET BY MOUTH 2 TIMES DAILY WITH A MEAL.   predniSONE 20 MG tablet Commonly known as: DELTASONE Take 3 tabs PO daily x 5 days.   Semaglutide-Weight Management 0.25 MG/0.5ML Soaj Inject 0.25 mg into the skin once a week.        Allergies:  Allergies  Allergen Reactions   Sulfa Antibiotics Hives    Past Medical History, Surgical history, Social history, and Family History were reviewed and updated.  Review of Systems: All other 10 point review of systems is negative.    Physical Exam:  weight is 226 lb 1.3 oz (102.5 kg). Her oral temperature is 98.6 F (37 C). Her blood pressure is 118/72 and her pulse is 77. Her respiration is 17 and oxygen saturation is 100%.   Wt Readings from Last 3 Encounters:  06/11/22 226 lb 1.3 oz (102.5 kg)  04/27/22 225 lb (102.1 kg)  03/30/22 222 lb 9.6 oz (101 kg)    Ocular: Sclerae unicteric, pupils equal, round and reactive to light Ear-nose-throat: Oropharynx clear, dentition fair Lymphatic: No cervical or supraclavicular adenopathy Lungs no rales or rhonchi, good excursion bilaterally Heart regular rate and rhythm, no murmur appreciated Abd soft, nontender, positive bowel sounds MSK no focal spinal tenderness, no joint edema Neuro: non-focal, well-oriented, appropriate affect Breasts: Deferred   Lab Results  Component Value Date   WBC 9.4 06/11/2022   HGB 13.0 06/11/2022   HCT 39.9 06/11/2022   MCV 86.0 06/11/2022   PLT 424 (H) 06/11/2022   Lab Results  Component Value Date   FERRITIN 42 03/17/2022   IRON 52 03/17/2022   TIBC 347 03/17/2022   UIBC 295 03/17/2022   IRONPCTSAT 15 03/17/2022   Lab Results  Component Value Date   RETICCTPCT 1.4 06/11/2022   RBC 4.54 06/11/2022   RBC 4.64 06/11/2022   No results found for: "KPAFRELGTCHN", "  LAMBDASER", "KAPLAMBRATIO" No results found for: "IGGSERUM", "IGA", "IGMSERUM" No results found for: "TOTALPROTELP", "ALBUMINELP", "A1GS", "A2GS", "BETS", "BETA2SER", "GAMS", "MSPIKE", "SPEI"   Chemistry      Component Value Date/Time   NA 139 03/30/2022 1057   K 4.8 03/30/2022 1057   CL 102 03/30/2022 1057   CO2 29 03/30/2022 1057   BUN 9 03/30/2022 1057   CREATININE 0.65 03/30/2022 1057   CREATININE 0.75 01/15/2021 1442   CREATININE 0.62 02/27/2019 1345      Component Value Date/Time   CALCIUM 9.8 03/30/2022 1057   ALKPHOS 55 03/30/2022 1057   AST 16 03/30/2022 1057   AST 27 01/15/2021 1442   ALT 19 03/30/2022 1057   ALT 23 01/15/2021 1442   BILITOT  0.4 03/30/2022 1057   BILITOT 0.4 01/15/2021 1442       Impression and Plan: [name] is a very pleasant 36 yo caucasian female with history of iron deficiency anemia due to heavy cycles and GI blood loss.  Iron studies are pending.  Her pharmacy ran out of fusion plus but she was able to restart 5 days ago after about a month without it.  Follow-up in 4 months.   Eileen Stanford, NP 8/24/20238:46 AM

## 2022-06-12 ENCOUNTER — Ambulatory Visit: Payer: BC Managed Care – PPO | Admitting: Family

## 2022-06-12 ENCOUNTER — Other Ambulatory Visit: Payer: BC Managed Care – PPO

## 2022-06-16 ENCOUNTER — Ambulatory Visit: Payer: BC Managed Care – PPO | Admitting: Family

## 2022-06-16 ENCOUNTER — Other Ambulatory Visit: Payer: BC Managed Care – PPO

## 2022-06-27 ENCOUNTER — Other Ambulatory Visit: Payer: Self-pay

## 2022-06-27 ENCOUNTER — Ambulatory Visit
Admission: RE | Admit: 2022-06-27 | Discharge: 2022-06-27 | Disposition: A | Discharge status: Home / Self Care | Payer: BC Managed Care – PPO | Source: Ambulatory Visit | Attending: Family Medicine | Admitting: Family Medicine

## 2022-06-27 VITALS — BP 114/75 | HR 80 | Temp 98.8°F | Resp 17

## 2022-06-27 DIAGNOSIS — J020 Streptococcal pharyngitis: Secondary | ICD-10-CM | POA: Diagnosis not present

## 2022-06-27 DIAGNOSIS — H6123 Impacted cerumen, bilateral: Secondary | ICD-10-CM

## 2022-06-27 DIAGNOSIS — R509 Fever, unspecified: Secondary | ICD-10-CM

## 2022-06-27 LAB — POC SARS CORONAVIRUS 2 AG -  ED: SARS Coronavirus 2 Ag: NEGATIVE

## 2022-06-27 LAB — SARS CORONAVIRUS 2 BY RT PCR: SARS Coronavirus 2 by RT PCR: NEGATIVE

## 2022-06-27 LAB — POCT RAPID STREP A (OFFICE): Rapid Strep A Screen: POSITIVE — AB

## 2022-06-27 MED ORDER — PENICILLIN V POTASSIUM 500 MG PO TABS: 500.0000 mg | ORAL_TABLET | Freq: Three times a day (TID) | ORAL | 0 refills | Status: AC

## 2022-06-27 MED ORDER — PREDNISONE 20 MG PO TABS: ORAL_TABLET | ORAL | 0 refills | Status: DC

## 2022-06-27 NOTE — ED Provider Notes (Signed)
[facility]    CSN: [id number] Arrival date & time: 06/27/22  0854      History   Chief Complaint Chief Complaint  Patient presents with   Fever   Sore Throat   Otalgia    HPI Joanna Banks is a 36 y.o. female.   HPI 36 year old female presents with sore throat that started yesterday.  Reports oral temperature of 101.3 fever earlier this morning taking Tylenol as needed.  Denies COVID-19 exposure.  PMH significant for obesity, frequent UTI, and anemia.  Past Medical History:  Diagnosis Date   Anemia    Anxiety    History of gallstones    Obesity    PCOS (polycystic ovarian syndrome)    UTI (urinary tract infection)     Patient Active Problem List   Diagnosis Date Noted   Preventative health care 06/12/2021   Anxiety 06/12/2021   IDA (iron deficiency anemia) 01/15/2021   Obesity 07/12/2018    Past Surgical History:  Procedure Laterality Date   CESAREAN SECTION  2012   CHOLECYSTECTOMY  11/2019   [facility]   WISDOM TOOTH EXTRACTION      OB History     Gravida  3   Para  3   Term  3   Preterm      AB      Living  3      SAB      IAB      Ectopic      Multiple  0   Live Births  1            Home Medications    Prior to Admission medications   Medication Sig Start Date End Date Taking? Authorizing Provider  penicillin v potassium (VEETID) 500 MG tablet Take 1 tablet (500 mg total) by mouth 3 (three) times daily for 7 days. 06/27/22 07/04/22 Yes Trevor Iha, FNP  predniSONE (DELTASONE) 20 MG tablet Take 3 tabs PO daily x 5 days. 06/27/22  Yes Trevor Iha, FNP  acetaminophen (TYLENOL) 500 MG tablet Take 500 mg by mouth every 6 (six) hours as needed.    [provider]  Iron-FA-B Cmp-C-Biot-Probiotic (FUSION PLUS) CAPS Take 1 capsule by mouth daily. 12/16/21   Erenest Blank, NP  metFORMIN (GLUCOPHAGE) 500 MG tablet TAKE 1 TABLET BY MOUTH 2 TIMES DAILY WITH A MEAL. 07/08/21   Reva Bores, MD   Semaglutide-Weight Management 0.25 MG/0.5ML SOAJ Inject 0.25 mg into the skin once a week. 04/01/22   Saguier, Ramon Dredge, PA-C    Family History Family History  Problem Relation Age of Onset   Diabetes Mother    Diabetes Father    Heart disease Father    Breast cancer Maternal Aunt 31   Lung cancer Maternal Grandfather    Lung cancer Paternal Grandfather    Colon polyps Half-Sister    Colon cancer Neg Hx    Esophageal cancer Neg Hx    Rectal cancer Neg Hx    Stomach cancer Neg Hx     Social History Social History   Tobacco Use   Smoking status: Former    Types: Cigarettes   Smokeless tobacco: Never  Vaping Use   Vaping Use: Never used  Substance Use Topics   Alcohol use: No   Drug use: No     Allergies   Sulfa antibiotics   Review of Systems Review of Systems  Constitutional:  Positive for fever.  All other systems reviewed and are negative.  Physical Exam Triage Vital Signs ED Triage Vitals  Enc Vitals Group     BP 06/27/22 0912 114/75     Pulse Rate 06/27/22 0912 80     Resp 06/27/22 0912 17     Temp 06/27/22 0912 98.8 F (37.1 C)     Temp Source 06/27/22 0912 Oral     SpO2 06/27/22 0912 98 %     Weight --      Height --      Head Circumference --      Peak Flow --      Pain Score 06/27/22 0914 0     Pain Loc --      Pain Edu? --      Excl. in GC? --    No data found.  Updated Vital Signs BP 114/75 (BP Location: Right Arm)   Pulse 80   Temp 98.8 F (37.1 C) (Oral)   Resp 17   SpO2 98%    Physical Exam Vitals and nursing note reviewed.  Constitutional:      Appearance: She is obese.  HENT:     Head: Normocephalic and atraumatic.     Right Ear: External ear normal.     Left Ear: External ear normal.     Ears:     Comments: Unable to visualize either TM due to cerumen impaction bilaterally; post bilateral ear lavage: Left EAC-clear; left TM-clear, retracted; right EAC-clear; right TM-clear, with scant serous effusions noted     Mouth/Throat:     Mouth: Mucous membranes are moist.     Pharynx: Oropharynx is clear. Uvula midline. Posterior oropharyngeal erythema and uvula swelling present.     Tonsils: 2+ on the right. 2+ on the left.  Eyes:     Extraocular Movements: Extraocular movements intact.     Conjunctiva/sclera: Conjunctivae normal.     Pupils: Pupils are equal, round, and reactive to light.  Cardiovascular:     Rate and Rhythm: Normal rate and regular rhythm.     Pulses: Normal pulses.     Heart sounds: Normal heart sounds. No murmur heard. Pulmonary:     Effort: Pulmonary effort is normal.     Breath sounds: Normal breath sounds. No wheezing, rhonchi or rales.  Musculoskeletal:        General: Normal range of motion.     Cervical back: Normal range of motion and neck supple.  Skin:    General: Skin is warm and dry.  Neurological:     General: No focal deficit present.     Mental Status: She is alert and oriented to person, place, and time.      UC Treatments / Results  Labs (all labs ordered are listed, but only abnormal results are displayed) Labs Reviewed  POCT RAPID STREP A (OFFICE) - Abnormal; Notable for the following components:      Result Value   Rapid Strep A Screen Positive (*)    All other components within normal limits  SARS CORONAVIRUS 2 BY RT PCR  POC SARS CORONAVIRUS 2 AG -  ED    EKG   Radiology No results found.  Procedures Procedures (including critical care time)  Medications Ordered in UC Medications - No data to display  Initial Impression / Assessment and Plan / UC Course  I have reviewed the triage vital signs and the nursing notes.  Pertinent labs & imaging results that were available during my care of the patient were reviewed by me and considered in my medical  decision making (see chart for details).     MDM: 1.  Fever-rapid COVID-19 negative, lab [id number] ordered. Advised patient may take OTC Tylenol 1000 to 4000 mg in 24-hour.  for fever. 2.  Strep  pharyngitis-Pen-VK; 3.  Bilateral impacted cerumen-resolved with bilateral ear lavage.  Advised we will follow-up with COVID-19 PCR results once received. Advised patient may take OTC Tylenol 1000 to 4000 mg in 24-hour.  For fever.  Advised we will follow-up with COVID-19 PCR results once received.  Advised patient to take medication as directed with food to completion. Advised patient to take prednisone with first dose of Pen-VK for the next 5 of 7 days.  Encouraged patient to increase daily water intake while taking these medications.  Advised patient if symptoms worsen and/or unresolved please follow-up with PCP or here for further evaluation.  Work note provided to patient prior to discharge patient discharged home, hemodynamically stable. Final Clinical Impressions(s) / UC Diagnoses   Final diagnoses:  Fever, unspecified  Strep pharyngitis  Bilateral impacted cerumen     Discharge Instructions      Advised patient may take OTC Tylenol 1000 to 4000 mg in 24-hour.  For fever.  Advised we will follow-up with COVID-19 PCR results once received.  Advised patient to take medication as directed with food to completion. Advised patient to take prednisone with first dose of Pen-VK for the next 5 of 7 days.  Encouraged patient to increase daily water intake while taking these medications.  Advised patient if symptoms worsen and/or unresolved please follow-up with PCP or here for further evaluation.     ED Prescriptions     Medication Sig Dispense Auth. Provider   predniSONE (DELTASONE) 20 MG tablet Take 3 tabs PO daily x 5 days. 15 tablet Trevor Iha, FNP   penicillin v potassium (VEETID) 500 MG tablet Take 1 tablet (500 mg total) by mouth 3 (three) times daily for 7 days. 21 tablet Trevor Iha, FNP      PDMP not reviewed this encounter.   Trevor Iha, FNP 06/27/22 1121

## 2022-06-27 NOTE — Discharge Instructions (Addendum)
Advised patient may take OTC Tylenol 1000 to 4000 mg in 24-hour.  For fever.  Advised we will follow-up with COVID-19 PCR results once received.  Advised patient to take medication as directed with food to completion. Advised patient to take prednisone with first dose of Pen-VK for the next 5 of 7 days.  Encouraged patient to increase daily water intake while taking these medications.  Advised patient if symptoms worsen and/or unresolved please follow-up with PCP or here for further evaluation.

## 2022-06-27 NOTE — ED Triage Notes (Signed)
Pt c/o sore throat that started yesterday. 101.3 fever early this am. Taking tylenol prn. Last dose 6am. No known covid exposure.

## 2022-06-28 ENCOUNTER — Telehealth: Payer: Self-pay

## 2022-06-28 NOTE — Telephone Encounter (Signed)
TC to f/u after yesterday's visit to [facility]. No answer; left VM to call [phone] for problems or questions. 

## 2022-06-30 ENCOUNTER — Other Ambulatory Visit: Payer: Self-pay | Admitting: Medical

## 2022-07-14 ENCOUNTER — Encounter: Payer: Self-pay | Admitting: Medical

## 2022-07-14 MED ORDER — SEMAGLUTIDE-WEIGHT MANAGEMENT 0.25 MG/0.5ML ~~LOC~~ SOAJ: 0.2500 mg | SUBCUTANEOUS | 0 refills | Status: DC

## 2022-07-16 ENCOUNTER — Telehealth: Payer: Self-pay

## 2022-07-16 NOTE — Telephone Encounter (Signed)
[facility] called stated that Joanna Banks is on a national backorder , requested alternative     [id number] Reference number: [id number]

## 2022-07-17 ENCOUNTER — Other Ambulatory Visit: Payer: Self-pay | Admitting: Medical

## 2022-07-17 MED ORDER — TIRZEPATIDE 2.5 MG/0.5ML ~~LOC~~ SOAJ: 2.5000 mg | SUBCUTANEOUS | 0 refills | Status: DC

## 2022-07-17 MED ORDER — TRULICITY 0.75 MG/0.5ML ~~LOC~~ SOAJ: 0.7500 mg | SUBCUTANEOUS | 0 refills | Status: DC

## 2022-07-17 NOTE — Addendum Note (Signed)
Addended by: Anabel Halon on: 07/17/2022 03:48 PM   Modules accepted: Orders

## 2022-07-17 NOTE — Telephone Encounter (Signed)
All Pharmacy Suggested Alternatives:   liraglutide (VICTOZA) 18 MG/3ML SOPN Dulaglutide (TRULICITY) 9.97 FS/1.4EL SOPN Semaglutide,0.25 or 0.5MG /DOS, (OZEMPIC, 0.25 OR 0.5 MG/DOSE,) 2 MG/3ML SOPN

## 2022-07-17 NOTE — Addendum Note (Signed)
Addended by: Anabel Halon on: 07/17/2022 03:50 PM   Modules accepted: Orders

## 2022-07-17 NOTE — Addendum Note (Signed)
Addended by: Anabel Halon on: 07/17/2022 08:36 AM   Modules accepted: Orders

## 2022-07-17 NOTE — Telephone Encounter (Signed)
Pt notified via mychart

## 2022-07-20 ENCOUNTER — Telehealth: Payer: Self-pay | Admitting: Medical

## 2022-07-20 MED ORDER — TIRZEPATIDE 2.5 MG/0.5ML ~~LOC~~ SOAJ: 2.5000 mg | SUBCUTANEOUS | 0 refills | Status: DC

## 2022-07-20 NOTE — Telephone Encounter (Signed)
Opened to review  Raydel Hosick, PA-C  

## 2022-07-20 NOTE — Addendum Note (Signed)
Addended by: Anabel Halon on: 07/20/2022 12:43 PM   Modules accepted: Orders

## 2022-07-27 ENCOUNTER — Telehealth (INDEPENDENT_AMBULATORY_CARE_PROVIDER_SITE_OTHER): Payer: BC Managed Care – PPO | Admitting: Medical

## 2022-07-27 DIAGNOSIS — E669 Obesity, unspecified: Secondary | ICD-10-CM | POA: Diagnosis not present

## 2022-07-27 MED ORDER — SEMAGLUTIDE-WEIGHT MANAGEMENT 0.25 MG/0.5ML ~~LOC~~ SOAJ
0.2500 mg | SUBCUTANEOUS | 0 refills | Status: DC
  Filled 2022-08-13 – 2022-08-21 (×2): qty 2, 28d supply, fill #0

## 2022-07-27 MED ORDER — SEMAGLUTIDE-WEIGHT MANAGEMENT 0.25 MG/0.5ML ~~LOC~~ SOAJ: 0.2500 mg | SUBCUTANEOUS | 0 refills | Status: DC

## 2022-07-27 MED ORDER — SEMAGLUTIDE-WEIGHT MANAGEMENT 0.25 MG/0.5ML ~~LOC~~ SOAJ
0.2500 mg | SUBCUTANEOUS | 0 refills | Status: DC
Start: 2022-07-27 — End: 2022-07-27

## 2022-07-27 NOTE — Patient Instructions (Signed)
Obesity- we have been trying to fill med covered by your insurance. Limited option. Among 3 your insurance covers do think wegovy best option. Will get you to pick up wegovy and place it in folder up front by receptionist. Hopefully wegovy will be on your new plan. Would call around and see what is waiting time at various pharmacies. Our pharmacy at [facility] might be a good options.   If able to start/fill please let me know.   Will titrate you up once you start wegovy provided not side effects.   Follow up in 1-2 month after starting wegovy. If Joanna Banks is preferred rather than wegovy let me know.

## 2022-07-27 NOTE — Progress Notes (Signed)
   Subjective:    Patient ID: Joanna Banks, female    DOB: [birth year], 36 y.o.   MRN: [id number]  HPI  Virtual Visit via Video Note  I connected with Joanna Banks on 07/27/22 at  2:20 PM EDT by a video enabled telemedicine application and verified that I am speaking with the correct person using two identifiers.  Location: Patient: home Provider: office   I discussed the limitations of evaluation and management by telemedicine and the availability of in person appointments. The patient expressed understanding and agreed to proceed.  History of Present Illness:   Pt is obese.   Pt insurance does cover qsymia, saxenda and wegovy.   Pt insurance does not cover ozempic.   Pt insurance will change. New plan will start on August 19, 2022.   Mancel Parsons may be covered under new plan.   [facility] may have wegovy in near future.   Over the last years pt describes various attempts to lose weight and keep off unsuccesfully.   Observations/Objective: General-no acute distress, pleasant, oriented. Lungs- on inspection lungs appear unlabored. Neck- no tracheal deviation or jvd on inspection. Neuro- gross motor function appears intact.   Assessment and Plan: Patient Instructions  Obesity- we have been trying to fill med covered by your insurance. Limited option. Among 3 your insurance covers do think wegovy best option. Will get you to pick up wegovy and place it in folder up front by receptionist. Hopefully wegovy will be on your new plan. Would call around and see what is waiting time at various pharmacies. Our pharmacy at [facility] might be a good options.   If able to start/fill please let me know.   Will titrate you up once you start wegovy provided not side effects.   Follow up in 1-2 month after starting wegovy. If Joanna Banks is preferred rather than wegovy let me know.   Joanna Pai, PA-C   Follow Up Instructions:    I discussed the assessment and treatment plan with  the patient. The patient was provided an opportunity to ask questions and all were answered. The patient agreed with the plan and demonstrated an understanding of the instructions.   The patient was advised to call back or seek an in-person evaluation if the symptoms worsen or if the condition fails to improve as anticipated.     Joanna Pai, PA-C    Review of Systems  Constitutional:  Negative for chills and fatigue.  Respiratory:  Negative for apnea, cough, choking, chest tightness, shortness of breath and wheezing.   Cardiovascular:  Negative for chest pain and palpitations.  Gastrointestinal:  Negative for abdominal pain, constipation, nausea and vomiting.  Genitourinary:  Negative for difficulty urinating, dyspareunia, dysuria, flank pain and frequency.  Musculoskeletal:  Negative for back pain, joint swelling, neck pain and neck stiffness.  Skin:  Negative for rash.  Neurological:  Negative for syncope, facial asymmetry, weakness, numbness and headaches.  Hematological:  Negative for adenopathy. Does not bruise/bleed easily.  Psychiatric/Behavioral:  Negative for behavioral problems and decreased concentration.        Objective:   Physical Exam        Assessment & Plan:

## 2022-08-13 ENCOUNTER — Encounter: Payer: Self-pay | Admitting: Family

## 2022-08-13 ENCOUNTER — Other Ambulatory Visit (HOSPITAL_BASED_OUTPATIENT_CLINIC_OR_DEPARTMENT_OTHER): Payer: Self-pay

## 2022-08-21 ENCOUNTER — Other Ambulatory Visit (HOSPITAL_BASED_OUTPATIENT_CLINIC_OR_DEPARTMENT_OTHER): Payer: Self-pay

## 2022-08-24 ENCOUNTER — Encounter: Payer: Self-pay | Admitting: Family

## 2022-08-24 ENCOUNTER — Other Ambulatory Visit (HOSPITAL_BASED_OUTPATIENT_CLINIC_OR_DEPARTMENT_OTHER): Payer: Self-pay

## 2022-09-07 ENCOUNTER — Other Ambulatory Visit: Payer: Self-pay | Admitting: Medical

## 2022-09-07 ENCOUNTER — Other Ambulatory Visit (HOSPITAL_BASED_OUTPATIENT_CLINIC_OR_DEPARTMENT_OTHER): Payer: Self-pay

## 2022-09-07 MED ORDER — WEGOVY 0.25 MG/0.5ML ~~LOC~~ SOAJ
0.2500 mg | SUBCUTANEOUS | 0 refills | Status: DC
  Filled 2022-09-07: qty 2, 28d supply, fill #0

## 2022-09-12 ENCOUNTER — Encounter: Payer: Self-pay | Admitting: Medical

## 2022-09-14 ENCOUNTER — Other Ambulatory Visit (HOSPITAL_BASED_OUTPATIENT_CLINIC_OR_DEPARTMENT_OTHER): Payer: Self-pay

## 2022-09-14 MED ORDER — WEGOVY 0.25 MG/0.5ML ~~LOC~~ SOAJ
0.2500 mg | SUBCUTANEOUS | 0 refills | Status: DC
  Filled 2022-09-14: qty 2, 28d supply, fill #0

## 2022-09-22 ENCOUNTER — Other Ambulatory Visit (HOSPITAL_BASED_OUTPATIENT_CLINIC_OR_DEPARTMENT_OTHER): Payer: Self-pay

## 2022-09-22 MED ORDER — SEMAGLUTIDE-WEIGHT MANAGEMENT 1 MG/0.5ML ~~LOC~~ SOAJ: 1.0000 mg | SUBCUTANEOUS | 0 refills | Status: DC

## 2022-09-22 MED ORDER — SEMAGLUTIDE-WEIGHT MANAGEMENT 0.5 MG/0.5ML ~~LOC~~ SOAJ: 0.5000 mg | SUBCUTANEOUS | 0 refills | Status: DC

## 2022-09-22 NOTE — Addendum Note (Signed)
Addended by: Gwenevere Abbot on: 09/22/2022 12:12 PM   Modules accepted: Orders

## 2022-09-24 ENCOUNTER — Other Ambulatory Visit (HOSPITAL_BASED_OUTPATIENT_CLINIC_OR_DEPARTMENT_OTHER): Payer: Self-pay

## 2022-09-24 MED ORDER — SEMAGLUTIDE-WEIGHT MANAGEMENT 0.5 MG/0.5ML ~~LOC~~ SOAJ
0.5000 mg | SUBCUTANEOUS | 0 refills | Status: DC
  Filled 2022-09-24 – 2022-11-13 (×3): qty 2, 28d supply, fill #0

## 2022-09-24 MED ORDER — SEMAGLUTIDE-WEIGHT MANAGEMENT 1 MG/0.5ML ~~LOC~~ SOAJ
1.0000 mg | SUBCUTANEOUS | 0 refills | Status: AC
  Filled 2022-09-24 – 2022-12-14 (×4): qty 2, 28d supply, fill #0

## 2022-09-24 NOTE — Addendum Note (Signed)
Addended by: Maximino Sarin on: 09/24/2022 02:18 PM   Modules accepted: Orders

## 2022-09-25 ENCOUNTER — Other Ambulatory Visit (HOSPITAL_BASED_OUTPATIENT_CLINIC_OR_DEPARTMENT_OTHER): Payer: Self-pay

## 2022-10-03 ENCOUNTER — Encounter: Payer: Self-pay | Admitting: Obstetrics & Gynecology

## 2022-10-05 ENCOUNTER — Other Ambulatory Visit: Payer: Self-pay | Admitting: *Deleted

## 2022-10-05 MED ORDER — METFORMIN HCL 500 MG PO TABS: 500.0000 mg | ORAL_TABLET | Freq: Two times a day (BID) | ORAL | 4 refills | Status: DC

## 2022-10-08 ENCOUNTER — Other Ambulatory Visit (HOSPITAL_BASED_OUTPATIENT_CLINIC_OR_DEPARTMENT_OTHER): Payer: Self-pay

## 2022-10-11 ENCOUNTER — Telehealth: Payer: BC Managed Care – PPO | Admitting: Family Medicine

## 2022-10-11 ENCOUNTER — Encounter: Payer: Self-pay | Admitting: Family

## 2022-10-11 DIAGNOSIS — J111 Influenza due to unidentified influenza virus with other respiratory manifestations: Secondary | ICD-10-CM | POA: Diagnosis not present

## 2022-10-11 MED ORDER — OSELTAMIVIR PHOSPHATE 75 MG PO CAPS: 75.0000 mg | ORAL_CAPSULE | Freq: Two times a day (BID) | ORAL | 0 refills | Status: AC

## 2022-10-11 NOTE — Progress Notes (Signed)
E visit for Flu like symptoms ?  ?We are sorry that you are not feeling well.  Here is how we plan to help! ?Based on what you have shared with me it looks like you may have a respiratory virus that may be influenza. ? ?Influenza or ?the flu? is   an infection caused by a respiratory virus. The flu virus is highly contagious and persons who did not receive their yearly flu vaccination may ?catch? the flu from close contact. ? ?We have anti-viral medications to treat the viruses that cause this infection. They are not a ?cure? and only shorten the course of the infection. These prescriptions are most effective when they are given within the first 2 days of ?flu? symptoms. Antiviral medication are indicated if you have a high risk of complications from the flu. You should  also consider an antiviral medication if you are in close contact with someone who is at risk. These medications can help patients avoid complications from the flu  but have side effects that you should know. Possible side effects from Tamiflu or oseltamivir include nausea, vomiting, diarrhea, dizziness, headaches, eye redness, sleep problems or other respiratory symptoms. ?You should not take Tamiflu if you have an allergy to oseltamivir or any to the ingredients in Tamiflu. ? ?Based upon your symptoms and potential risk factors I have prescribed Oseltamivir (Tamiflu).  It has been sent to your designated pharmacy.  You will take one 75 mg capsule orally twice a day for the next 5 days. ? ?ANYONE WHO HAS FLU SYMPTOMS SHOULD: ?Stay home. The flu is highly contagious and going out or to work exposes others! ?Be sure to drink plenty of fluids. Water is fine as well as fruit juices, sodas and electrolyte beverages. You may want to stay away from caffeine or alcohol. If you are nauseated, try taking small sips of liquids. How do you know if you are getting enough fluid? Your urine should be a pale yellow or almost colorless. ?Get rest. ?Taking a steamy  shower or using a humidifier may help nasal congestion and ease sore throat pain. Using a saline nasal spray works much the same way. ?Cough drops, hard candies and sore throat lozenges may ease your cough. ?Line up a caregiver. Have someone check on you regularly. ? ? ?GET HELP RIGHT AWAY IF: ?You cannot keep down liquids or your medications. ?You become short of breath ?Your fell like you are going to pass out or loose consciousness. ?Your symptoms persist after you have completed your treatment plan ?MAKE SURE YOU  ?Understand these instructions. ?Will watch your condition. ?Will get help right away if you are not doing well or get worse. ? ?Your e-visit answers were reviewed by a board certified advanced clinical practitioner to complete your personal care plan.  Depending on the condition, your plan could have included both over the counter or prescription medications. ? ?If there is a problem please reply  once you have received a response from your provider. ? ?Your safety is important to us.  If you have drug allergies check your prescription carefully.   ? ?You can use MyChart to ask questions about today?s visit, request a non-urgent call back, or ask for a work or school excuse for 24 hours related to this e-Visit. If it has been greater than 24 hours you will need to follow up with your provider, or enter a new e-Visit to address those concerns. ? ?You will get an e-mail in the next   two days asking about your experience.  I hope that your e-visit has been valuable and will speed your recovery. Thank you for using e-visits.  ? ?I have provided 5 minutes of non face to face time during this encounter for chart review and documentation.   ?

## 2022-10-20 ENCOUNTER — Other Ambulatory Visit (HOSPITAL_BASED_OUTPATIENT_CLINIC_OR_DEPARTMENT_OTHER): Payer: Self-pay

## 2022-10-20 MED ORDER — SEMAGLUTIDE-WEIGHT MANAGEMENT 0.25 MG/0.5ML ~~LOC~~ SOAJ
0.2500 mg | SUBCUTANEOUS | 0 refills | Status: DC
  Filled 2022-10-20: qty 2, 28d supply, fill #0

## 2022-10-20 NOTE — Addendum Note (Signed)
Addended by: Anabel Halon on: 10/20/2022 05:17 PM   Modules accepted: Orders

## 2022-10-23 ENCOUNTER — Other Ambulatory Visit (HOSPITAL_BASED_OUTPATIENT_CLINIC_OR_DEPARTMENT_OTHER): Payer: Self-pay

## 2022-10-29 ENCOUNTER — Encounter: Payer: Self-pay | Admitting: Family

## 2022-10-31 ENCOUNTER — Other Ambulatory Visit (HOSPITAL_BASED_OUTPATIENT_CLINIC_OR_DEPARTMENT_OTHER): Payer: Self-pay

## 2022-11-04 ENCOUNTER — Other Ambulatory Visit (HOSPITAL_BASED_OUTPATIENT_CLINIC_OR_DEPARTMENT_OTHER): Payer: Self-pay

## 2022-11-04 ENCOUNTER — Telehealth: Payer: Self-pay

## 2022-11-04 ENCOUNTER — Other Ambulatory Visit: Payer: Self-pay

## 2022-11-04 ENCOUNTER — Encounter: Payer: Self-pay | Admitting: Family

## 2022-11-04 NOTE — Telephone Encounter (Signed)
PA denied. Plan requires OV notes with BMI. Last OV note faxed to [facility] 1 at [phone]. Awaiting determination

## 2022-11-04 NOTE — Telephone Encounter (Signed)
Received PA form. Form completed and faxed back to [facility] I at [phone] w/ last ov note. (From 10/23- may require a new OV). Awaiting determination.

## 2022-11-04 NOTE — Telephone Encounter (Signed)
PA initiated via Covermymeds; KEY: [id number]. Awaiting determination.

## 2022-11-12 ENCOUNTER — Encounter: Payer: Self-pay | Admitting: Family

## 2022-11-12 NOTE — Telephone Encounter (Signed)
Pt needs new OV in person to be weighed. Mychart message sent.

## 2022-11-12 NOTE — Telephone Encounter (Signed)
Called to follow up on PA, spoke with [place], and they stated that it was nothing on file for [facility].    New prior auth started  Prior auth started via cover my meds.  Awaiting determination.  Key: [id number]

## 2022-11-13 ENCOUNTER — Ambulatory Visit: Payer: BC Managed Care – PPO | Admitting: Medical

## 2022-11-13 ENCOUNTER — Other Ambulatory Visit (HOSPITAL_BASED_OUTPATIENT_CLINIC_OR_DEPARTMENT_OTHER): Payer: Self-pay

## 2022-11-13 VITALS — BP 120/86 | HR 80 | Resp 18 | Ht 64.0 in | Wt 233.5 lb

## 2022-11-13 DIAGNOSIS — Z6841 Body Mass Index (BMI) 40.0 and over, adult: Secondary | ICD-10-CM | POA: Diagnosis not present

## 2022-11-13 NOTE — Progress Notes (Signed)
Subjective:    Patient ID: Joanna Banks, female    DOB: 1986-05-21, 37 y.o.   MRN: [id number]  HPI  Pt states her insurance was requiring another office visit before filling wegovy.  Pt states she did start wegovy 0.25 mg IM but used only 2 months before interruption due to supply issue. BMI 40. Pt had no side effect with dosage other than minimal nausea when first started med.   No pancreatitis and no family history of thyroid cancer.   Pt lost 8 lbs when she was on medication.    Review of Systems  Constitutional:  Negative for chills, fatigue and fever.  HENT:  Negative for congestion and drooling.   Respiratory:  Negative for cough, chest tightness, shortness of breath and wheezing.   Cardiovascular:  Negative for chest pain and palpitations.  Gastrointestinal:  Negative for abdominal pain.  Genitourinary:  Negative for dysuria and enuresis.  Musculoskeletal:  Negative for back pain and joint swelling.  Neurological:  Negative for dizziness.  Hematological:  Negative for adenopathy. Does not bruise/bleed easily.  Psychiatric/Behavioral:  Negative for behavioral problems and confusion.     Past Medical History:  Diagnosis Date   Anemia    Anxiety    History of gallstones    Obesity    PCOS (polycystic ovarian syndrome)    UTI (urinary tract infection)      Social History   Socioeconomic History   Marital status: Married    Spouse name: Not on file   Number of children: Not on file   Years of education: Not on file   Highest education level: Not on file  Occupational History   Occupation: Teacher  Tobacco Use   Smoking status: Former    Types: Cigarettes   Smokeless tobacco: Never  Vaping Use   Vaping Use: Never used  Substance and Sexual Activity   Alcohol use: No   Drug use: No   Sexual activity: Yes    Birth control/protection: Condom  Other Topics Concern   Not on file  Social History Narrative   Not on file   Social Determinants of Health    Financial Resource Strain: Not on file  Food Insecurity: Not on file  Transportation Needs: Not on file  Physical Activity: Not on file  Stress: Not on file  Social Connections: Not on file  Intimate Partner Violence: Not on file    Past Surgical History:  Procedure Laterality Date   CESAREAN SECTION  2012   CHOLECYSTECTOMY  11/2019   [facility]   WISDOM TOOTH EXTRACTION      Family History  Problem Relation Age of Onset   Diabetes Mother    Diabetes Father    Heart disease Father    Breast cancer Maternal Aunt 67   Lung cancer Maternal Grandfather    Lung cancer Paternal Grandfather    Colon polyps Half-Sister    Colon cancer Neg Hx    Esophageal cancer Neg Hx    Rectal cancer Neg Hx    Stomach cancer Neg Hx     Allergies  Allergen Reactions   Sulfa Antibiotics Hives    Current Outpatient Medications on File Prior to Visit  Medication Sig Dispense Refill   acetaminophen (TYLENOL) 500 MG tablet Take 500 mg by mouth every 6 (six) hours as needed.     Iron-FA-B Cmp-C-Biot-Probiotic (FUSION PLUS) CAPS Take 1 capsule by mouth daily. 30 capsule 11   metFORMIN (GLUCOPHAGE) 500 MG tablet Take 1  tablet (500 mg total) by mouth 2 (two) times daily with a meal. 180 tablet 4   Semaglutide-Weight Management 0.25 MG/0.5ML SOAJ Inject 0.25 mg into the skin once a week. 2 mL 0   Semaglutide-Weight Management 0.5 MG/0.5ML SOAJ Inject 0.5 mg into the skin once a week. 2 mL 0   [START ON 11/19/2022] Semaglutide-Weight Management 1 MG/0.5ML SOAJ Inject 1 mg into the skin once a week for 28 days. 2 mL 0   predniSONE (DELTASONE) 20 MG tablet Take 3 tabs PO daily x 5 days. 15 tablet 0   No current facility-administered medications on file prior to visit.    BP 120/86   Pulse 80   Resp 18   Ht 5\' 4"  (1.626 m)   Wt 233 lb 8 oz (105.9 kg)   LMP 10/23/2022   SpO2 100%   BMI 40.08 kg/m        Objective:   Physical Exam  General Mental Status- Alert. General  Appearance- Not in acute distress.   Skin General: Color- Normal Color. Moisture- Normal Moisture.  Neck Carotid Arteries- Normal color. Moisture- Normal Moisture. No carotid bruits. No JVD.  Chest and Lung Exam Auscultation: Breath Sounds:-Normal.  Cardiovascular Auscultation:Rythm- Regular. Murmurs & Other Heart Sounds:Auscultation of the heart reveals- No Murmurs.  Abdomen Inspection:-Inspeection Normal. Palpation/Percussion:Note:No mass. Palpation and Percussion of the abdomen reveal- Non Tender, Non Distended + BS, no rebound or guarding.  Neurologic Cranial Nerve exam:- CN III-XII intact(No nystagmus), symmetric smile. Strength:- 5/5 equal and symmetric strength both upper and lower extremities.       Assessment & Plan:   Patient Instructions  Obesity- bmi of 40. You have pending 0.5 mg wegovy rx presently. Continue diet and exercise.  When you fill the 1 mg wegovy dose let me know and will send in new rx of higher titration dose.  Follow up in 4 month or sooner if needed.   Mackie Pai, PA-C    [employer]

## 2022-11-13 NOTE — Patient Instructions (Addendum)
Obesity- bmi of 40. You have pending 0.5 mg wegovy rx presently. Continue diet and exercise.  When you fill the 1 mg wegovy dose let me know and will send in new rx of higher titration dose.  Follow up in 4 month or sooner if needed.

## 2022-11-16 ENCOUNTER — Other Ambulatory Visit (HOSPITAL_BASED_OUTPATIENT_CLINIC_OR_DEPARTMENT_OTHER): Payer: Self-pay

## 2022-11-16 NOTE — Telephone Encounter (Signed)
New PA initiated via Covermymeds; KEY: [id number]. Awaiting determination.

## 2022-11-16 NOTE — Telephone Encounter (Signed)
PA approved. Effective 11/16/2022 - 06/17/2023.

## 2022-11-19 ENCOUNTER — Other Ambulatory Visit (HOSPITAL_BASED_OUTPATIENT_CLINIC_OR_DEPARTMENT_OTHER): Payer: Self-pay

## 2022-11-24 ENCOUNTER — Other Ambulatory Visit (HOSPITAL_BASED_OUTPATIENT_CLINIC_OR_DEPARTMENT_OTHER): Payer: Self-pay

## 2022-11-30 ENCOUNTER — Other Ambulatory Visit (HOSPITAL_BASED_OUTPATIENT_CLINIC_OR_DEPARTMENT_OTHER): Payer: Self-pay

## 2022-12-02 ENCOUNTER — Other Ambulatory Visit (HOSPITAL_BASED_OUTPATIENT_CLINIC_OR_DEPARTMENT_OTHER): Payer: Self-pay

## 2022-12-14 ENCOUNTER — Other Ambulatory Visit (HOSPITAL_BASED_OUTPATIENT_CLINIC_OR_DEPARTMENT_OTHER): Payer: Self-pay

## 2022-12-14 ENCOUNTER — Encounter: Payer: Self-pay | Admitting: Medical

## 2022-12-14 ENCOUNTER — Other Ambulatory Visit: Payer: Self-pay

## 2022-12-15 ENCOUNTER — Encounter: Payer: Self-pay | Admitting: Family

## 2022-12-15 ENCOUNTER — Other Ambulatory Visit (HOSPITAL_BASED_OUTPATIENT_CLINIC_OR_DEPARTMENT_OTHER): Payer: Self-pay

## 2022-12-15 MED ORDER — SEMAGLUTIDE-WEIGHT MANAGEMENT 2.4 MG/0.75ML ~~LOC~~ SOAJ
2.4000 mg | SUBCUTANEOUS | 0 refills | Status: AC
  Filled 2022-12-15 – 2023-04-15 (×2): qty 3, 28d supply, fill #0

## 2022-12-15 MED ORDER — SEMAGLUTIDE-WEIGHT MANAGEMENT 1.7 MG/0.75ML ~~LOC~~ SOAJ
1.7000 mg | SUBCUTANEOUS | 0 refills | Status: AC
  Filled 2022-12-15 – 2023-01-08 (×4): qty 3, 28d supply, fill #0

## 2022-12-15 MED ORDER — SEMAGLUTIDE-WEIGHT MANAGEMENT 1.7 MG/0.75ML ~~LOC~~ SOAJ: 1.7000 mg | SUBCUTANEOUS | 0 refills | Status: DC

## 2022-12-15 MED ORDER — SEMAGLUTIDE-WEIGHT MANAGEMENT 2.4 MG/0.75ML ~~LOC~~ SOAJ: 2.4000 mg | SUBCUTANEOUS | 0 refills | Status: DC

## 2022-12-15 NOTE — Addendum Note (Signed)
Addended by: Anabel Halon on: 12/15/2022 06:32 AM   Modules accepted: Orders

## 2022-12-15 NOTE — Addendum Note (Signed)
Addended by: Anabel Halon on: 12/15/2022 12:39 PM   Modules accepted: Orders

## 2022-12-16 ENCOUNTER — Other Ambulatory Visit (HOSPITAL_BASED_OUTPATIENT_CLINIC_OR_DEPARTMENT_OTHER): Payer: Self-pay

## 2022-12-25 ENCOUNTER — Other Ambulatory Visit (HOSPITAL_BASED_OUTPATIENT_CLINIC_OR_DEPARTMENT_OTHER): Payer: Self-pay

## 2023-01-08 ENCOUNTER — Other Ambulatory Visit (HOSPITAL_BASED_OUTPATIENT_CLINIC_OR_DEPARTMENT_OTHER): Payer: Self-pay

## 2023-01-08 ENCOUNTER — Other Ambulatory Visit: Payer: Self-pay

## 2023-02-06 ENCOUNTER — Telehealth: Payer: BC Managed Care – PPO | Admitting: Nurse Practitioner

## 2023-02-06 DIAGNOSIS — N760 Acute vaginitis: Secondary | ICD-10-CM | POA: Diagnosis not present

## 2023-02-06 DIAGNOSIS — B9689 Other specified bacterial agents as the cause of diseases classified elsewhere: Secondary | ICD-10-CM | POA: Diagnosis not present

## 2023-02-06 MED ORDER — METRONIDAZOLE 500 MG PO TABS: 500.0000 mg | ORAL_TABLET | Freq: Two times a day (BID) | ORAL | 0 refills | Status: AC

## 2023-02-06 NOTE — Progress Notes (Signed)
E-Visit for Vaginal Symptoms  We are sorry that you are not feeling well. Here is how we plan to help! Based on what you shared with me I have sent metronidazole for BV.   Vaginosis is an inflammation of the vagina that can result in discharge, itching and pain. The cause is usually a change in the normal balance of vaginal bacteria or an infection. Vaginosis can also result from reduced estrogen levels after menopause.  The most common causes of vaginosis are:   Bacterial vaginosis which results from an overgrowth of one on several organisms that are normally present in your vagina.   Yeast infections which are caused by a naturally occurring fungus called candida.   Vaginal atrophy (atrophic vaginosis) which results from the thinning of the vagina from reduced estrogen levels after menopause.   Trichomoniasis which is caused by a parasite and is commonly transmitted by sexual intercourse.  Factors that increase your risk of developing vaginosis include: Medications, such as antibiotics and steroids Uncontrolled diabetes Use of hygiene products such as bubble bath, vaginal spray or vaginal deodorant Douching Wearing damp or tight-fitting clothing Using an intrauterine device (IUD) for birth control Hormonal changes, such as those associated with pregnancy, birth control pills or menopause Sexual activity Having a sexually transmitted infection  Your treatment plan is Metronidazole or Flagyl  twice a day for 7 days.  I have electronically sent this prescription into the pharmacy that you have chosen.  Be sure to take all of the medication as directed. Stop taking any medication if you develop a rash, tongue swelling or shortness of breath. Mothers who are breast feeding should consider pumping and discarding their breast milk while on these antibiotics. However, there is no consensus that infant exposure at these doses would be harmful.  Remember that medication creams can weaken  latex condoms. Marland Kitchen   HOME CARE:  Good hygiene may prevent some types of vaginosis from recurring and may relieve some symptoms:  Avoid baths, hot tubs and whirlpool spas. Rinse soap from your outer genital area after a shower, and dry the area well to prevent irritation. Don't use scented or harsh soaps, such as those with deodorant or antibacterial action. Avoid irritants. These include scented tampons and pads. Wipe from front to back after using the toilet. Doing so avoids spreading fecal bacteria to your vagina.  Other things that may help prevent vaginosis include:  Don't douche. Your vagina doesn't require cleansing other than normal bathing. Repetitive douching disrupts the normal organisms that reside in the vagina and can actually increase your risk of vaginal infection. Douching won't clear up a vaginal infection. Use a latex condom. Both female and female latex condoms may help you avoid infections spread by sexual contact. Wear cotton underwear. Also wear pantyhose with a cotton crotch. If you feel comfortable without it, skip wearing underwear to bed. Yeast thrives in [employer] Your symptoms should improve in the next day or two.  GET HELP RIGHT AWAY IF:  You have pain in your lower abdomen ( pelvic area or over your ovaries) You develop nausea or vomiting You develop a fever Your discharge changes or worsens You have persistent pain with intercourse You develop shortness of breath, a rapid pulse, or you faint.  These symptoms could be signs of problems or infections that need to be evaluated by a medical provider now.  MAKE SURE YOU   Understand these instructions. Will watch your condition. Will get help right away if you are  not doing well or get worse.  Thank you for choosing an e-visit.  Your e-visit answers were reviewed by a board certified advanced clinical practitioner to complete your personal care plan. Depending upon the condition, your plan could  have included both over the counter or prescription medications.  Please review your pharmacy choice. Make sure the pharmacy is open so you can pick up prescription now. If there is a problem, you may contact your provider through [employer] and have the prescription routed to another pharmacy.  Your safety is important to [place]. If you have drug allergies check your prescription carefully.   For the next 24 hours you can use MyChart to ask questions about today's visit, request a non-urgent call back, or ask for a work or school excuse. You will get an email in the next two days asking about your experience. I hope that your e-visit has been valuable and will speed your recovery.

## 2023-02-06 NOTE — Progress Notes (Signed)
I have spent 5 minutes in review of e-visit questionnaire, review and updating patient chart, medical decision making and response to patient.  ° °Joanna Chisum W Henryetta Corriveau, NP ° °  °

## 2023-02-13 ENCOUNTER — Other Ambulatory Visit: Payer: Self-pay | Admitting: Family

## 2023-02-13 DIAGNOSIS — D500 Iron deficiency anemia secondary to blood loss (chronic): Secondary | ICD-10-CM

## 2023-04-16 ENCOUNTER — Other Ambulatory Visit (HOSPITAL_BASED_OUTPATIENT_CLINIC_OR_DEPARTMENT_OTHER): Payer: Self-pay

## 2023-04-20 ENCOUNTER — Encounter: Payer: Self-pay | Admitting: Medical

## 2023-04-27 ENCOUNTER — Other Ambulatory Visit (HOSPITAL_BASED_OUTPATIENT_CLINIC_OR_DEPARTMENT_OTHER): Payer: Self-pay

## 2023-06-19 ENCOUNTER — Other Ambulatory Visit: Payer: Self-pay

## 2023-06-19 ENCOUNTER — Ambulatory Visit
Admission: EM | Admit: 2023-06-19 | Discharge: 2023-06-19 | Disposition: A | Discharge status: Home / Self Care | Payer: BC Managed Care – PPO | Attending: Family Medicine | Admitting: Family Medicine

## 2023-06-19 DIAGNOSIS — J020 Streptococcal pharyngitis: Secondary | ICD-10-CM | POA: Diagnosis not present

## 2023-06-19 LAB — POCT RAPID STREP A (OFFICE): Rapid Strep A Screen: POSITIVE — AB

## 2023-06-19 MED ORDER — AMOXICILLIN 875 MG PO TABS: 875.0000 mg | ORAL_TABLET | Freq: Two times a day (BID) | ORAL | 0 refills | Status: AC

## 2023-06-19 NOTE — ED Provider Notes (Signed)
[facility]    CSN: [id number] Arrival date & time: 06/19/23  0906      History   Chief Complaint No chief complaint on file.   HPI Joanna Banks is a 37 y.o. female.   HPI  Patient states that she has developed a sore throat, slight cough, fever and aches.  No known exposure to strep or COVID.  Symptoms started yesterday morning.  Here for evaluation.  Patient is a Runner, broadcasting/film/video.  No one else at home is sick  Past Medical History:  Diagnosis Date   Anemia    Anxiety    History of gallstones    Obesity    PCOS (polycystic ovarian syndrome)    UTI (urinary tract infection)     Patient Active Problem List   Diagnosis Date Noted   Preventative health care 06/12/2021   Anxiety 06/12/2021   IDA (iron deficiency anemia) 01/15/2021   Obesity 07/12/2018    Past Surgical History:  Procedure Laterality Date   CESAREAN SECTION  2012   CHOLECYSTECTOMY  11/2019   [facility]   WISDOM TOOTH EXTRACTION      OB History     Gravida  3   Para  3   Term  3   Preterm      AB      Living  3      SAB      IAB      Ectopic      Multiple  0   Live Births  1            Home Medications    Prior to Admission medications   Medication Sig Start Date End Date Taking? Authorizing Provider  amoxicillin (AMOXIL) 875 MG tablet Take 1 tablet (875 mg total) by mouth 2 (two) times daily for 10 days. 06/19/23 06/29/23 Yes Eustace Moore, MD  Iron-FA-B Cmp-C-Biot-Probiotic (FUSION PLUS) CAPS TAKE 1 CAPSULE BY MOUTH EVERY DAY 02/13/23   Josph Macho, MD  metFORMIN (GLUCOPHAGE) 500 MG tablet Take 1 tablet (500 mg total) by mouth 2 (two) times daily with a meal. 10/05/22   Lesly Dukes, MD    Family History Family History  Problem Relation Age of Onset   Diabetes Mother    Diabetes Father    Heart disease Father    Breast cancer Maternal Aunt 15   Lung cancer Maternal Grandfather    Lung cancer Paternal Grandfather    Colon polyps  Half-Sister    Colon cancer Neg Hx    Esophageal cancer Neg Hx    Rectal cancer Neg Hx    Stomach cancer Neg Hx     Social History Social History   Tobacco Use   Smoking status: Former    Types: Cigarettes   Smokeless tobacco: Never  Vaping Use   Vaping status: Never Used  Substance Use Topics   Alcohol use: No   Drug use: No     Allergies   Sulfa antibiotics   Review of Systems Review of Systems See HPI  Physical Exam Triage Vital Signs ED Triage Vitals  Encounter Vitals Group     BP 06/19/23 0914 123/79     Systolic BP Percentile --      Diastolic BP Percentile --      Pulse Rate 06/19/23 0914 95     Resp 06/19/23 0914 16     Temp 06/19/23 0914 98.8 F (37.1 C)     Temp Source 06/19/23 0914  Oral     SpO2 06/19/23 0914 99 %     Weight --      Height --      Head Circumference --      Peak Flow --      Pain Score 06/19/23 0915 5     Pain Loc --      Pain Education --      Exclude from Growth Chart --    No data found.  Updated Vital Signs BP 123/79 (BP Location: Left Arm)   Pulse 95   Temp 98.8 F (37.1 C) (Oral)   Resp 16   SpO2 99%      Physical Exam Constitutional:      General: She is not in acute distress.    Appearance: She is well-developed. She is ill-appearing.  HENT:     Head: Normocephalic and atraumatic.     Right Ear: Tympanic membrane normal.     Left Ear: Tympanic membrane normal.     Nose: No congestion or rhinorrhea.     Mouth/Throat:     Pharynx: Posterior oropharyngeal erythema present.     Comments: Tonsils are moderately swollen.  Erythema.  No exudate Eyes:     Conjunctiva/sclera: Conjunctivae normal.     Pupils: Pupils are equal, round, and reactive to light.  Cardiovascular:     Rate and Rhythm: Normal rate.  Pulmonary:     Effort: Pulmonary effort is normal. No respiratory distress.  Abdominal:     General: There is no distension.     Palpations: Abdomen is soft.  Musculoskeletal:        General: Normal  range of motion.     Cervical back: Normal range of motion.  Lymphadenopathy:     Cervical: Cervical adenopathy present.  Skin:    General: Skin is warm and dry.  Neurological:     Mental Status: She is alert.      UC Treatments / Results  Labs (all labs ordered are listed, but only abnormal results are displayed) Labs Reviewed  POCT RAPID STREP A (OFFICE) - Abnormal; Notable for the following components:      Result Value   Rapid Strep A Screen Positive (*)    All other components within normal limits    EKG   Radiology No results found.  Procedures Procedures (including critical care time)  Medications Ordered in UC Medications - No data to display  Initial Impression / Assessment and Plan / UC Course  I have reviewed the triage vital signs and the nursing notes.  Pertinent labs & imaging results that were available during my care of the patient were reviewed by me and considered in my medical decision making (see chart for details).     Final Clinical Impressions(s) / UC Diagnoses   Final diagnoses:  Streptococcal sore throat     Discharge Instructions      Take amoxicillin 2 times a day for 10 full days Consider taking a probiotic to protect your stomach May take Tylenol or ibuprofen for pain and fever Call for problems     ED Prescriptions     Medication Sig Dispense Auth. Provider   amoxicillin (AMOXIL) 875 MG tablet Take 1 tablet (875 mg total) by mouth 2 (two) times daily for 10 days. 20 tablet Eustace Moore, MD      PDMP not reviewed this encounter.   Eustace Moore, MD 06/19/23 [phone]

## 2023-06-19 NOTE — ED Triage Notes (Addendum)
Yesterday woke up with sore throat, then began running fever (to 100.4F) and having bil ear pain. Minimal cough.

## 2023-06-19 NOTE — Discharge Instructions (Signed)
Take amoxicillin 2 times a day for 10 full days Consider taking a probiotic to protect your stomach May take Tylenol or ibuprofen for pain and fever Call for problems

## 2023-07-16 ENCOUNTER — Ambulatory Visit: Payer: BC Managed Care – PPO | Admitting: Physician Assistant

## 2023-07-16 ENCOUNTER — Other Ambulatory Visit (HOSPITAL_BASED_OUTPATIENT_CLINIC_OR_DEPARTMENT_OTHER): Payer: Self-pay

## 2023-07-16 ENCOUNTER — Encounter: Payer: Self-pay | Admitting: Physician Assistant

## 2023-07-16 VITALS — BP 126/80 | HR 65 | Temp 98.6°F | Resp 16 | Ht 64.0 in | Wt 210.1 lb

## 2023-07-16 DIAGNOSIS — J069 Acute upper respiratory infection, unspecified: Secondary | ICD-10-CM | POA: Diagnosis not present

## 2023-07-16 LAB — POCT INFLUENZA A/B
Influenza A, POC: NEGATIVE
Influenza B, POC: NEGATIVE

## 2023-07-16 LAB — POC COVID19 BINAXNOW: SARS Coronavirus 2 Ag: NEGATIVE

## 2023-07-16 MED ORDER — ALBUTEROL SULFATE HFA 108 (90 BASE) MCG/ACT IN AERS
2.0000 | INHALATION_SPRAY | Freq: Four times a day (QID) | RESPIRATORY_TRACT | 2 refills | Status: DC | PRN
  Filled 2023-07-16: qty 6.7, 25d supply, fill #0

## 2023-07-16 NOTE — Progress Notes (Signed)
Established patient visit   Patient: Joanna Banks   DOB: November 14, 1985   37 y.o. Female  MRN: [id number] Visit Date: 07/16/2023  Today's healthcare provider: Alfredia Ferguson, PA-C   Chief Complaint  Patient presents with   Sore Throat    X1 day, headache, body aches Fever-99.9-100 Has had strep 2 times in last month    Subjective    Sore Throat  Pertinent negatives include no abdominal pain, coughing, headaches or shortness of breath.    Pt reports recent strep infection twice, with two round of abx therapy -- 8/31 and again 9/14. She finished her abx Monday. Yesterday she started with a fever, 99-100 F, headache, body aches. She wants to make sure strep is gone. Some chest tightness.  Medications: Outpatient Medications Prior to Visit  Medication Sig   Iron-FA-B Cmp-C-Biot-Probiotic (FUSION PLUS) CAPS TAKE 1 CAPSULE BY MOUTH EVERY DAY   metFORMIN (GLUCOPHAGE) 500 MG tablet Take 1 tablet (500 mg total) by mouth 2 (two) times daily with a meal.   No facility-administered medications prior to visit.   Review of Systems  Constitutional:  Negative for fatigue and fever.  Respiratory:  Negative for cough and shortness of breath.   Cardiovascular:  Negative for chest pain and leg swelling.  Gastrointestinal:  Negative for abdominal pain.  Neurological:  Negative for dizziness and headaches.      Objective    BP 126/80   Pulse 65   Temp 98.6 F (37 C) (Oral)   Resp 16   Ht 5\' 4"  (1.626 m)   Wt 210 lb 2 oz (95.3 kg)   LMP 07/15/2023   SpO2 96%   BMI 36.07 kg/m   Physical Exam Constitutional:      General: She is awake.     Appearance: She is well-developed.  HENT:     Head: Normocephalic.     Right Ear: Tympanic membrane normal.     Left Ear: Tympanic membrane normal.     Mouth/Throat:     Pharynx: Posterior oropharyngeal erythema present. No oropharyngeal exudate.     Tonsils: 0 on the right. 0 on the left.  Eyes:     Conjunctiva/sclera: Conjunctivae  normal.  Cardiovascular:     Rate and Rhythm: Normal rate and regular rhythm.     Heart sounds: Normal heart sounds.  Pulmonary:     Effort: Pulmonary effort is normal.     Breath sounds: Normal breath sounds.  Skin:    General: Skin is warm.  Neurological:     Mental Status: She is alert and oriented to person, place, and time.  Psychiatric:        Attention and Perception: Attention normal.        Mood and Affect: Mood normal.        Speech: Speech normal.        Behavior: Behavior is cooperative.      Results for orders placed or performed in visit on 07/16/23  POC COVID-19  Result Value Ref Range   SARS Coronavirus 2 Ag Negative Negative  POCT Influenza A/B  Result Value Ref Range   Influenza A, POC Negative Negative   Influenza B, POC Negative Negative    Assessment & Plan     1. Upper respiratory tract infection, unspecified type Give repeated strep infections-- does not appear clinically, but culture done to prove treatment.  Poc covid/flu negative. Recommending repeat covid test tomorrow if she feels worse-- but advise we would not  treat differently than a cold   Rx albuterol inhaler if her chest tightness worsens, but lungs cta Hydrate, rest, tylenol/mucinex  - albuterol (VENTOLIN HFA) 108 (90 Base) MCG/ACT inhaler; Inhale 2 puffs into the lungs every 6 (six) hours as needed for wheezing or shortness of breath.  Dispense: 6.7 g; Refill: 2 - Culture, Group A Strep - POC COVID-19 - POCT Influenza A/B  Return if symptoms worsen or fail to improve.      I, Alfredia Ferguson, PA-C have reviewed all documentation for this visit. The documentation on  07/16/23   for the exam, diagnosis, procedures, and orders are all accurate and complete.    Alfredia Ferguson, PA-C  [facility] at [facility] [phone] (phone) [phone] (fax)  [facility]

## 2023-07-19 ENCOUNTER — Encounter: Payer: Self-pay | Admitting: Physician Assistant

## 2023-07-19 ENCOUNTER — Other Ambulatory Visit: Payer: Self-pay | Admitting: Physician Assistant

## 2023-07-19 DIAGNOSIS — B379 Candidiasis, unspecified: Secondary | ICD-10-CM

## 2023-07-19 MED ORDER — FLUCONAZOLE 150 MG PO TABS: 150.0000 mg | ORAL_TABLET | Freq: Once | ORAL | 0 refills | Status: AC

## 2023-07-23 ENCOUNTER — Other Ambulatory Visit: Payer: Self-pay | Admitting: Physician Assistant

## 2023-07-23 DIAGNOSIS — J020 Streptococcal pharyngitis: Secondary | ICD-10-CM

## 2023-07-23 MED ORDER — FLUCONAZOLE 150 MG PO TABS: 150.0000 mg | ORAL_TABLET | Freq: Once | ORAL | 0 refills | Status: AC

## 2023-07-23 MED ORDER — CLINDAMYCIN HCL 300 MG PO CAPS: 300.0000 mg | ORAL_CAPSULE | Freq: Three times a day (TID) | ORAL | 0 refills | Status: AC

## 2023-07-24 LAB — SUSCEPTIBILITY PANEL,AEROBIC BACTERIUM
Micro Number:: 15548044
Specimen Quality:: ADEQUATE

## 2023-07-24 LAB — CULTURE, GROUP A STREP
Micro Number: 15524892
SPECIMEN QUALITY:: ADEQUATE

## 2023-07-24 LAB — TEST AUTHORIZATION

## 2023-10-11 ENCOUNTER — Encounter: Payer: Self-pay | Admitting: Medical Oncology

## 2023-10-11 ENCOUNTER — Inpatient Hospital Stay: Payer: BC Managed Care – PPO | Admitting: Medical Oncology

## 2023-10-11 ENCOUNTER — Inpatient Hospital Stay: Payer: BC Managed Care – PPO | Attending: Hematology & Oncology

## 2023-10-11 ENCOUNTER — Other Ambulatory Visit: Payer: Self-pay | Admitting: Medical Oncology

## 2023-10-11 VITALS — BP 121/74 | HR 71 | Temp 98.1°F | Resp 18 | Ht 64.0 in | Wt 202.1 lb

## 2023-10-11 DIAGNOSIS — D500 Iron deficiency anemia secondary to blood loss (chronic): Secondary | ICD-10-CM

## 2023-10-11 DIAGNOSIS — N920 Excessive and frequent menstruation with regular cycle: Secondary | ICD-10-CM | POA: Insufficient documentation

## 2023-10-11 LAB — CBC
HCT: 34 % — ABNORMAL LOW (ref 36.0–46.0)
Hemoglobin: 10.6 g/dL — ABNORMAL LOW (ref 12.0–15.0)
MCH: 21.3 pg — ABNORMAL LOW (ref 26.0–34.0)
MCHC: 31.2 g/dL (ref 30.0–36.0)
MCV: 68.3 fL — ABNORMAL LOW (ref 80.0–100.0)
Platelets: 595 10*3/uL — ABNORMAL HIGH (ref 150–400)
RBC: 4.98 MIL/uL (ref 3.87–5.11)
RDW: 20.2 % — ABNORMAL HIGH (ref 11.5–15.5)
WBC: 11.8 10*3/uL — ABNORMAL HIGH (ref 4.0–10.5)
nRBC: 0 % (ref 0.0–0.2)

## 2023-10-11 LAB — RETIC PANEL
Immature Retic Fract: 17.2 % — ABNORMAL HIGH (ref 2.3–15.9)
RBC.: 4.95 MIL/uL (ref 3.87–5.11)
Retic Count, Absolute: 38.6 10*3/uL (ref 19.0–186.0)
Retic Ct Pct: 0.8 % (ref 0.4–3.1)
Reticulocyte Hemoglobin: 23.6 pg — ABNORMAL LOW (ref >27.9)

## 2023-10-11 LAB — IRON AND IRON BINDING CAPACITY (CC-WL,HP ONLY)
Iron: 17 ug/dL — ABNORMAL LOW (ref 28–170)
Saturation Ratios: 4 % — ABNORMAL LOW (ref 10.4–31.8)
TIBC: 472 ug/dL — ABNORMAL HIGH (ref 250–450)
UIBC: 455 ug/dL — ABNORMAL HIGH (ref 148–442)

## 2023-10-11 LAB — FERRITIN: Ferritin: 2 ng/mL — ABNORMAL LOW (ref 11–307)

## 2023-10-11 MED ORDER — FUSION PLUS PO CAPS: 1.0000 | ORAL_CAPSULE | Freq: Every day | ORAL | 11 refills | Status: AC

## 2023-10-11 NOTE — Progress Notes (Signed)
Hematology and Oncology Follow Up Visit  Joanna Banks [id number] 1986-01-01 37 y.o. 10/11/2023   Principle Diagnosis:  Iron deficiency anemia    Current Therapy:        Fusion plus oral iron supplement daily IV iron as indicated   Interim History:  Joanna Banks is here today for follow-up.  Today she states that her fatigue has worsened. She thinks this is due to not taking her fusion plus daily- rx ran out months ago.    She has also noticed that her menstrual cycles over the past 2 cycles has been irregular and heavier in nature.  No other blood loss noted. No bruising or petechiae.  No fever, chills, n/v, cough, rash, dizziness, SOB, chest pain, palpitations, abdominal pain or changes in bowel or bladder habits.  No swelling, tenderness, numbness or tingling in her extremities.  No falls or syncope. Appetite and hydration are good.  Wt Readings from Last 3 Encounters:  10/11/23 202 lb 1.9 oz (91.7 kg)  07/16/23 210 lb 2 oz (95.3 kg)  11/13/22 233 lb 8 oz (105.9 kg)   ECOG Performance Status: 1 - Symptomatic but completely ambulatory  Medications:  Allergies as of 10/11/2023       Reactions   Sulfa Antibiotics Hives        Medication List        Accurate as of October 11, 2023 10:56 AM. If you have any questions, ask your nurse or doctor.          STOP taking these medications    albuterol 108 (90 Base) MCG/ACT inhaler Commonly known as: VENTOLIN HFA Stopped by: Rushie Chestnut       TAKE these medications    Fusion Plus Caps Take 1 capsule by mouth daily.   metFORMIN 500 MG tablet Commonly known as: GLUCOPHAGE Take 1 tablet (500 mg total) by mouth 2 (two) times daily with a meal.        Allergies:  Allergies  Allergen Reactions   Sulfa Antibiotics Hives    Past Medical History, Surgical history, Social history, and Family History were reviewed and updated.  Review of Systems: All other 10 point review of systems is negative.    Physical Exam:  height is 5\' 4"  (1.626 m) and weight is 202 lb 1.9 oz (91.7 kg). Her oral temperature is 98.1 F (36.7 C). Her blood pressure is 121/74 and her pulse is 71. Her respiration is 18 and oxygen saturation is 100%.   Wt Readings from Last 3 Encounters:  10/11/23 202 lb 1.9 oz (91.7 kg)  07/16/23 210 lb 2 oz (95.3 kg)  11/13/22 233 lb 8 oz (105.9 kg)    Ocular: Sclerae unicteric, pupils equal, round and reactive to light Ear-nose-throat: Oropharynx clear, dentition fair Lymphatic: No cervical or supraclavicular adenopathy Lungs no rales or rhonchi, good excursion bilaterally Heart regular rate and rhythm, no murmur appreciated Abd soft, nontender, positive bowel sounds MSK no focal spinal tenderness, no joint edema Neuro: non-focal, well-oriented, appropriate affect  Lab Results  Component Value Date   WBC 11.8 (H) 10/11/2023   HGB 10.6 (L) 10/11/2023   HCT 34.0 (L) 10/11/2023   MCV 68.3 (L) 10/11/2023   PLT 595 (H) 10/11/2023   Lab Results  Component Value Date   FERRITIN 25 06/11/2022   IRON 53 06/11/2022   TIBC 347 06/11/2022   UIBC 294 06/11/2022   IRONPCTSAT 15 06/11/2022   Lab Results  Component Value Date   RETICCTPCT 0.8 10/11/2023  RBC 4.95 10/11/2023   No results found for: "KPAFRELGTCHN", "LAMBDASER", "KAPLAMBRATIO" No results found for: "IGGSERUM", "IGA", "IGMSERUM" No results found for: "TOTALPROTELP", "ALBUMINELP", "A1GS", "A2GS", "BETS", "BETA2SER", "GAMS", "MSPIKE", "SPEI"   Chemistry      Component Value Date/Time   NA 139 03/30/2022 1057   K 4.8 03/30/2022 1057   CL 102 03/30/2022 1057   CO2 29 03/30/2022 1057   BUN 9 03/30/2022 1057   CREATININE 0.65 03/30/2022 1057   CREATININE 0.75 01/15/2021 1442   CREATININE 0.62 02/27/2019 1345      Component Value Date/Time   CALCIUM 9.8 03/30/2022 1057   ALKPHOS 55 03/30/2022 1057   AST 16 03/30/2022 1057   AST 27 01/15/2021 1442   ALT 19 03/30/2022 1057   ALT 23 01/15/2021 1442    BILITOT 0.4 03/30/2022 1057   BILITOT 0.4 01/15/2021 1442     Encounter Diagnosis  Name Primary?   Iron deficiency anemia due to chronic blood loss Yes    Impression and Plan: Ms. Duplechain is a very pleasant 37 yo caucasian female with history of iron deficiency anemia due to heavy cycles and GI blood loss.   Today Hgb is 10.6 which is down a bit from her last visit 1 year ago (13) Iron studies are pending but likely low.  We will restart her on Fusion Plus- she prefers to avoid IV iron if possible.   RTC 4 months APP, labs (CBC, iron, ferritin)  Rushie Chestnut, PA-C 12/23/202410:56 AM

## 2023-10-12 ENCOUNTER — Encounter: Payer: Self-pay | Admitting: Medical Oncology

## 2023-10-18 ENCOUNTER — Encounter: Payer: Self-pay | Admitting: Family

## 2023-10-27 ENCOUNTER — Other Ambulatory Visit: Payer: Self-pay | Admitting: Obstetrics & Gynecology

## 2023-11-02 ENCOUNTER — Encounter: Payer: Self-pay | Admitting: Family

## 2023-11-02 ENCOUNTER — Other Ambulatory Visit: Payer: Self-pay

## 2023-11-02 ENCOUNTER — Ambulatory Visit
Admission: RE | Admit: 2023-11-02 | Discharge: 2023-11-02 | Disposition: A | Discharge status: Home / Self Care | Payer: 59 | Source: Ambulatory Visit | Attending: Family Medicine | Admitting: Family Medicine

## 2023-11-02 VITALS — BP 120/80 | HR 87 | Temp 97.9°F | Resp 16

## 2023-11-02 DIAGNOSIS — J019 Acute sinusitis, unspecified: Secondary | ICD-10-CM

## 2023-11-02 DIAGNOSIS — B9789 Other viral agents as the cause of diseases classified elsewhere: Secondary | ICD-10-CM

## 2023-11-02 MED ORDER — AMOXICILLIN 875 MG PO TABS: 875.0000 mg | ORAL_TABLET | Freq: Two times a day (BID) | ORAL | 0 refills | Status: DC

## 2023-11-02 MED ORDER — FLUTICASONE PROPIONATE 50 MCG/ACT NA SUSP: 2.0000 | Freq: Every day | NASAL | 0 refills | Status: DC

## 2023-11-02 NOTE — Discharge Instructions (Signed)
 Drink lots of fluids Use the Flonase daily May also use decongestant and antihistamine products Fill and take the amoxicillin if you fail to see improvement as expected

## 2023-11-02 NOTE — ED Provider Notes (Signed)
 Joanna Banks CARE    CSN: [id number] Arrival date & time: 11/02/23  1326      History   Chief Complaint Chief Complaint  Patient presents with   sinus pressure     HPI Joanna Banks is a 38 y.o. female.   HPI  Patient is a chartered loss adjuster.  She is exposed to a lot of kids with infections.  She currently has sore throat headache sinus congestion and yellow and green sinus drainage.  It has been going on since yesterday.  She has been taking decongestants and antihistamines.  She is here to see if there is any other treatment available.  She has not been prone to recurring bacterial sinus infections  Past Medical History:  Diagnosis Date   Anemia    Anxiety    History of gallstones    Obesity    PCOS (polycystic ovarian syndrome)    UTI (urinary tract infection)     Patient Active Problem List   Diagnosis Date Noted   Preventative health care 06/12/2021   Anxiety 06/12/2021   IDA (iron  deficiency anemia) 01/15/2021   Obesity 07/12/2018    Past Surgical History:  Procedure Laterality Date   CESAREAN SECTION  2012   CHOLECYSTECTOMY  11/2019   [facility]   WISDOM TOOTH EXTRACTION      OB History     Gravida  3   Para  3   Term  3   Preterm      AB      Living  3      SAB      IAB      Ectopic      Multiple  0   Live Births  1            Home Medications    Prior to Admission medications   Medication Sig Start Date End Date Taking? Authorizing Provider  amoxicillin  (AMOXIL ) 875 MG tablet Take 1 tablet (875 mg total) by mouth 2 (two) times daily. 11/02/23  Yes Maranda Jamee Jacob, MD  fluticasone  (FLONASE ) 50 MCG/ACT nasal spray Place 2 sprays into both nostrils daily. 11/02/23  Yes Maranda Jamee Jacob, MD  Iron -FA-B Cmp-C-Biot-Probiotic (FUSION PLUS) CAPS Take 1 capsule by mouth daily. 10/11/23   Tonette Lauraine HERO, PA-C  metFORMIN  (GLUCOPHAGE ) 500 MG tablet Take 1 tablet (500 mg total) by mouth 2 (two) times daily with  a meal. 10/05/22   Cris Burnard DEL, MD    Family History Family History  Problem Relation Age of Onset   Diabetes Mother    Diabetes Father    Heart disease Father    Breast cancer Maternal Aunt 12   Lung cancer Maternal Grandfather    Lung cancer Paternal Grandfather    Colon polyps Half-Sister    Colon cancer Neg Hx    Esophageal cancer Neg Hx    Rectal cancer Neg Hx    Stomach cancer Neg Hx     Social History Social History   Tobacco Use   Smoking status: Former    Types: Cigarettes   Smokeless tobacco: Never  Vaping Use   Vaping status: Never Used  Substance Use Topics   Alcohol use: No   Drug use: No     Allergies   Sulfa antibiotics   Review of Systems Review of Systems  See HPI Physical Exam Triage Vital Signs ED Triage Vitals [11/02/23 1334]  Encounter Vitals Group     BP 120/80  Systolic BP Percentile      Diastolic BP Percentile      Pulse Rate 87     Resp 16     Temp 97.9 F (36.6 C)     Temp src      SpO2 98 %     Weight      Height      Head Circumference      Peak Flow      Pain Score 0     Pain Loc      Pain Education      Exclude from Growth Chart    No data found.  Updated Vital Signs BP 120/80   Pulse 87   Temp 97.9 F (36.6 C)   Resp 16   LMP 10/04/2023   SpO2 98%      Physical Exam Constitutional:      General: She is not in acute distress.    Appearance: She is well-developed and normal weight. She is ill-appearing.  HENT:     Head: Normocephalic and atraumatic.     Right Ear: Tympanic membrane and ear canal normal.     Left Ear: Tympanic membrane normal.     Nose: Congestion and rhinorrhea present.     Mouth/Throat:     Pharynx: Posterior oropharyngeal erythema present.     Comments: Patient does have yellow postnasal drip.  I did discuss with her that yellow and green nasal drainage does not indicate bacteria Eyes:     Conjunctiva/sclera: Conjunctivae normal.     Pupils: Pupils are equal, round, and  reactive to light.  Cardiovascular:     Rate and Rhythm: Normal rate.     Heart sounds: Normal heart sounds.  Pulmonary:     Effort: Pulmonary effort is normal. No respiratory distress.     Breath sounds: Normal breath sounds.  Abdominal:     General: There is no distension.     Palpations: Abdomen is soft.  Musculoskeletal:        General: Normal range of motion.     Cervical back: Normal range of motion and neck supple.  Lymphadenopathy:     Cervical: No cervical adenopathy.  Skin:    General: Skin is warm and dry.  Neurological:     Mental Status: She is alert.      UC Treatments / Results  Labs (all labs ordered are listed, but only abnormal results are displayed) Labs Reviewed - No data to display  EKG   Radiology No results found.  Procedures Procedures (including critical care time)  Medications Ordered in UC Medications - No data to display  Initial Impression / Assessment and Plan / UC Course  I have reviewed the triage vital signs and the nursing notes.  Pertinent labs & imaging results that were available during my care of the patient were reviewed by me and considered in my medical decision making (see chart for details).     Reviewed viral infections.  Need for antibiotics if she fails to improve by 7 to 10 days Final Clinical Impressions(s) / UC Diagnoses   Final diagnoses:  Acute viral sinusitis     Discharge Instructions      Drink lots of fluids Use the Flonase  daily May also use decongestant and antihistamine products Fill and take the amoxicillin  if you fail to see improvement as expected   ED Prescriptions     Medication Sig Dispense Auth. Provider   fluticasone  (FLONASE ) 50 MCG/ACT nasal spray Place 2 sprays  into both nostrils daily. 16 g Maranda Jamee Jacob, MD   amoxicillin  (AMOXIL ) 875 MG tablet Take 1 tablet (875 mg total) by mouth 2 (two) times daily. 14 tablet Maranda Jamee Jacob, MD      PDMP not reviewed this  encounter.   Maranda Jamee Jacob, MD 11/02/23 [phone]

## 2023-11-02 NOTE — ED Triage Notes (Signed)
 Pt presents to uc with co of headache, facial pressure and congestion, cough, and runny nose since yesterday. Pt denies any fevers. Pt reports she is a Runner, broadcasting/film/video. She has taken antihistamines.

## 2023-12-24 ENCOUNTER — Ambulatory Visit: Admitting: Physician Assistant

## 2023-12-24 ENCOUNTER — Encounter: Payer: Self-pay | Admitting: Physician Assistant

## 2023-12-24 VITALS — BP 103/67 | HR 75 | Ht 64.0 in | Wt 202.2 lb

## 2023-12-24 DIAGNOSIS — H6123 Impacted cerumen, bilateral: Secondary | ICD-10-CM | POA: Diagnosis not present

## 2023-12-24 NOTE — Progress Notes (Signed)
      Established patient visit   Patient: Joanna Banks   DOB: October 09, 1986   38 y.o. Female  MRN: [id number] Visit Date: 12/24/2023  Today's healthcare provider: Alfredia Ferguson, PA-C   Cc. Ear pain, headache   Subjective     Pt reports she cleared her earwax at home, now ears feel clogged, pain behind her ears, headache. She reports she often needs to clean her ears and they still feel clogged   Medications: Outpatient Medications Prior to Visit  Medication Sig   Iron-FA-B Cmp-C-Biot-Probiotic (FUSION PLUS) CAPS Take 1 capsule by mouth daily.   metFORMIN (GLUCOPHAGE) 500 MG tablet Take 1 tablet (500 mg total) by mouth 2 (two) times daily with a meal.   [DISCONTINUED] amoxicillin (AMOXIL) 875 MG tablet Take 1 tablet (875 mg total) by mouth 2 (two) times daily.   [DISCONTINUED] fluticasone (FLONASE) 50 MCG/ACT nasal spray Place 2 sprays into both nostrils daily.   No facility-administered medications prior to visit.    Review of Systems  Constitutional:  Negative for fatigue and fever.  HENT:  Positive for ear pain.   Respiratory:  Negative for cough and shortness of breath.   Cardiovascular:  Negative for chest pain and leg swelling.  Gastrointestinal:  Negative for abdominal pain.  Neurological:  Positive for headaches. Negative for dizziness.       Objective    BP 103/67   Pulse 75   Ht 5\' 4"  (1.626 m)   Wt 202 lb 3.2 oz (91.7 kg)   BMI 34.71 kg/m    Physical Exam Vitals reviewed.  Constitutional:      Appearance: She is not ill-appearing.  HENT:     Head: Normocephalic.     Right Ear: There is impacted cerumen.     Left Ear: There is impacted cerumen.  Eyes:     Conjunctiva/sclera: Conjunctivae normal.  Cardiovascular:     Rate and Rhythm: Normal rate.  Pulmonary:     Effort: Pulmonary effort is normal. No respiratory distress.  Neurological:     Mental Status: She is alert and oriented to person, place, and time.  Psychiatric:        Mood and  Affect: Mood normal.        Behavior: Behavior normal.     No results found for any visits on 12/24/23.  Assessment & Plan    Bilateral impacted cerumen -     Ambulatory referral to ENT  Significant deep canal impaction b/l. Given pt tried at home, offered irrigation today, declined. Referring to ent   Return if symptoms worsen or fail to improve.       Alfredia Ferguson, PA-C  [facility] at [facility] [phone] (phone) [phone] (fax)  [facility]

## 2023-12-24 NOTE — Patient Instructions (Addendum)
 [facility] ENT and Audiology - [facility] [address]  [address] [place], [place] [address]  [phone]

## 2023-12-28 ENCOUNTER — Telehealth (INDEPENDENT_AMBULATORY_CARE_PROVIDER_SITE_OTHER): Payer: Self-pay | Admitting: Physician Assistant

## 2023-12-28 NOTE — Telephone Encounter (Signed)
 LVM to confirm appt & location [id number]

## 2023-12-29 ENCOUNTER — Ambulatory Visit: Admitting: Medical

## 2023-12-29 ENCOUNTER — Encounter (INDEPENDENT_AMBULATORY_CARE_PROVIDER_SITE_OTHER): Payer: Self-pay

## 2023-12-29 ENCOUNTER — Ambulatory Visit (INDEPENDENT_AMBULATORY_CARE_PROVIDER_SITE_OTHER): Admitting: Physician Assistant

## 2023-12-29 VITALS — BP 132/86 | HR 88 | Ht 64.0 in | Wt 196.0 lb

## 2023-12-29 DIAGNOSIS — H6123 Impacted cerumen, bilateral: Secondary | ICD-10-CM

## 2023-12-29 NOTE — Progress Notes (Unsigned)
 Dear Dr. Ok Edwards, Here is my assessment for our mutual patient, Joanna Banks. Thank you for allowing me the opportunity to care for your patient. Please do not hesitate to contact me should you have any other questions.  Sincerely, Burna Forts PA-C  Otolaryngology Clinic Note Referring provider: Dr. Ok Edwards HPI:  Joanna Banks is a 38 y.o. female kindly referred by Dr. Ok Edwards   The patient is a 38 year old female seen in our office for cerumen impaction.  The patient notes that she recently went to her primary care provider and was told she had bilateral cerumen impaction.  She has had some pressure in her ears as well is some muffled speech.  She has never had any formal cleaning other than irrigation in the office.  She denies any issues with the ears no history head or neck surgery, no history of hearing loss or recurrent infections.    Independent Review of Additional Tests or Records:  none   PMH/Meds/All/SocHx/FamHx/ROS:   Past Medical History:  Diagnosis Date   Anemia    Anxiety    History of gallstones    Obesity    PCOS (polycystic ovarian syndrome)    UTI (urinary tract infection)      Past Surgical History:  Procedure Laterality Date   CESAREAN SECTION  2012   CHOLECYSTECTOMY  11/2019   [facility]   WISDOM TOOTH EXTRACTION      Family History  Problem Relation Age of Onset   Diabetes Mother    Diabetes Father    Heart disease Father    Breast cancer Maternal Aunt 69   Lung cancer Maternal Grandfather    Lung cancer Paternal Grandfather    Colon polyps Half-Sister    Colon cancer Neg Hx    Esophageal cancer Neg Hx    Rectal cancer Neg Hx    Stomach cancer Neg Hx      Social Connections: Not on file      Current Outpatient Medications:    Iron-FA-B Cmp-C-Biot-Probiotic (FUSION PLUS) CAPS, Take 1 capsule by mouth daily., Disp: 30 capsule, Rfl: 11   metFORMIN (GLUCOPHAGE) 500 MG tablet, Take 1 tablet (500 mg total) by mouth 2 (two) times  daily with a meal., Disp: 180 tablet, Rfl: 4   Physical Exam:   BP 132/86 (BP Location: Left Arm, Patient Position: Sitting, Cuff Size: Normal)   Pulse 88   Ht 5\' 4"  (1.626 m)   Wt 196 lb (88.9 kg)   SpO2 98%   BMI 33.64 kg/m   Pertinent Findings  CN II-XII grossly intact Bilateral EAC with cerumen impaction Weber 512: equal Rinne 512: AC > BC b/l  Anterior rhinoscopy: Septum midline; bilateral inferior turbinates with no hypertrophy  No lesions of oral cavity/oropharynx; dentition wnl No obviously palpable neck masses/lymphadenopathy/thyromegaly No respiratory distress or stridor  Seprately Identifiable Procedures:  Procedure: Bilateral ear microscopy and cerumen removal using microscope (CPT [phone]) - Mod 50 Pre-procedure diagnosis: bilateral cerumen impaction external auditory canals Post-procedure diagnosis: same Indication: bilateral cerumen impaction; given patient's otologic complaints and history as well as for improved and comprehensive examination of external ear and tympanic membrane, bilateral otologic examination using microscope was performed and impacted cerumen removed  Procedure: Patient was placed semi-recumbent. Both ear canals were examined using the microscope with findings above. Cerumen removed from bilateral external auditory canals using suction and currette with improvement in EAC examination and patency. Left: EAC was patent. TM was intact . Middle ear was aerated. Drainage: none Right: EAC  was patent. TM was intact . Middle ear was aerated . Drainage: none Patient tolerated the procedure well.   Impression & Plans:  Joanna Banks is a 38 y.o. female with the following   Cerumen impaction-  The patient presented today with cerumen impaction.  This was removed without difficulty.  I see no signs of infection.  The patient will reach out to the office if she develops any new or worsening signs or symptoms.  She does not feel she has any significant change  to her baseline hearing and did not elect for audiology evaluation today.    - f/u PRN   Thank you for allowing me the opportunity to care for your patient. Please do not hesitate to contact me should you have any other questions.  Sincerely, Burna Forts PA-C [facility] Phone: [phone] Fax: [phone]  12/29/2023, 2:08 PM

## 2024-02-09 ENCOUNTER — Inpatient Hospital Stay: Payer: Self-pay | Attending: Medical Oncology

## 2024-02-09 ENCOUNTER — Inpatient Hospital Stay (HOSPITAL_BASED_OUTPATIENT_CLINIC_OR_DEPARTMENT_OTHER): Payer: BC Managed Care – PPO | Admitting: Family

## 2024-02-09 ENCOUNTER — Encounter: Payer: Self-pay | Admitting: Family

## 2024-02-09 VITALS — BP 108/54 | HR 71 | Temp 98.9°F | Resp 17 | Wt 200.0 lb

## 2024-02-09 DIAGNOSIS — N920 Excessive and frequent menstruation with regular cycle: Secondary | ICD-10-CM | POA: Insufficient documentation

## 2024-02-09 DIAGNOSIS — D500 Iron deficiency anemia secondary to blood loss (chronic): Secondary | ICD-10-CM

## 2024-02-09 LAB — FERRITIN: Ferritin: 13 ng/mL (ref 11–307)

## 2024-02-09 LAB — IRON AND IRON BINDING CAPACITY (CC-WL,HP ONLY)
Iron: 64 ug/dL (ref 28–170)
Saturation Ratios: 17 % (ref 10.4–31.8)
TIBC: 381 ug/dL (ref 250–450)
UIBC: 317 ug/dL (ref 148–442)

## 2024-02-09 LAB — CBC
HCT: 39.2 % (ref 36.0–46.0)
Hemoglobin: 12.6 g/dL (ref 12.0–15.0)
MCH: 25.9 pg — ABNORMAL LOW (ref 26.0–34.0)
MCHC: 32.1 g/dL (ref 30.0–36.0)
MCV: 80.5 fL (ref 80.0–100.0)
Platelets: 483 10*3/uL — ABNORMAL HIGH (ref 150–400)
RBC: 4.87 MIL/uL (ref 3.87–5.11)
RDW: 15.8 % — ABNORMAL HIGH (ref 11.5–15.5)
WBC: 9.7 10*3/uL (ref 4.0–10.5)
nRBC: 0 % (ref 0.0–0.2)

## 2024-02-09 NOTE — Progress Notes (Signed)
 Hematology and Oncology Follow Up Visit  Joanna Banks [id number] [birth year] 38 y.o. 02/09/2024   Principle Diagnosis:  Iron  deficiency anemia    Current Therapy:        Fusion plus oral iron  supplement daily IV iron  as indicated   Interim History:  Joanna Banks is here today for follow-up. She is doing well but has some fatigue at times.  Her cycle is unchanged from baseline. Heavy flow, no clots.  No other blood loss noted. No bruising or petechiae.  She has had a few palpitations.  No fever, chills, n/v, cough, rash, dizziness, SOB, chest pain, abdominal pain or changes in bowel or bladder habits.  No swelling, tenderness, numbness or tingling in her extremities.  No falls or syncope.  Appetite and hydration are good. Weight is stable at 200 lbs.   ECOG Performance Status: 1 - Symptomatic but completely ambulatory  Medications:  Allergies as of 02/09/2024       Reactions   Sulfa Antibiotics Hives        Medication List        Accurate as of February 09, 2024 11:18 AM. If you have any questions, ask your nurse or doctor.          Fusion Plus Caps Take 1 capsule by mouth daily.   metFORMIN  500 MG tablet Commonly known as: GLUCOPHAGE  Take 1 tablet (500 mg total) by mouth 2 (two) times daily with a meal.        Allergies:  Allergies  Allergen Reactions   Sulfa Antibiotics Hives    Past Medical History, Surgical history, Social history, and Family History were reviewed and updated.  Review of Systems: All other 10 point review of systems is negative.   Physical Exam:  weight is 200 lb (90.7 kg). Her oral temperature is 98.9 F (37.2 C). Her blood pressure is 108/54 (abnormal) and her pulse is 71. Her respiration is 17 and oxygen saturation is 100%.   Wt Readings from Last 3 Encounters:  02/09/24 200 lb (90.7 kg)  12/29/23 196 lb (88.9 kg)  12/24/23 202 lb 3.2 oz (91.7 kg)    Ocular: Sclerae unicteric, pupils equal, round and reactive to  light Ear-nose-throat: Oropharynx clear, dentition fair Lymphatic: No cervical or supraclavicular adenopathy Lungs no rales or rhonchi, good excursion bilaterally Heart regular rate and rhythm, no murmur appreciated Abd soft, nontender, positive bowel sounds MSK no focal spinal tenderness, no joint edema Neuro: non-focal, well-oriented, appropriate affect Breasts: Deferred   Lab Results  Component Value Date   WBC 9.7 02/09/2024   HGB 12.6 02/09/2024   HCT 39.2 02/09/2024   MCV 80.5 02/09/2024   PLT 483 (H) 02/09/2024   Lab Results  Component Value Date   FERRITIN 2 (L) 10/11/2023   IRON  17 (L) 10/11/2023   TIBC 472 (H) 10/11/2023   UIBC 455 (H) 10/11/2023   IRONPCTSAT 4 (L) 10/11/2023   Lab Results  Component Value Date   RETICCTPCT 0.8 10/11/2023   RBC 4.87 02/09/2024   No results found for: "KPAFRELGTCHN", "LAMBDASER", "KAPLAMBRATIO" No results found for: "IGGSERUM", "IGA", "IGMSERUM" No results found for: "TOTALPROTELP", "ALBUMINELP", "A1GS", "A2GS", "BETS", "BETA2SER", "GAMS", "MSPIKE", "SPEI"   Chemistry      Component Value Date/Time   NA 139 03/30/2022 1057   K 4.8 03/30/2022 1057   CL 102 03/30/2022 1057   CO2 29 03/30/2022 1057   BUN 9 03/30/2022 1057   CREATININE 0.65 03/30/2022 1057   CREATININE 0.75 01/15/2021 1442  CREATININE 0.62 02/27/2019 1345      Component Value Date/Time   CALCIUM 9.8 03/30/2022 1057   ALKPHOS 55 03/30/2022 1057   AST 16 03/30/2022 1057   AST 27 01/15/2021 1442   ALT 19 03/30/2022 1057   ALT 23 01/15/2021 1442   BILITOT 0.4 03/30/2022 1057   BILITOT 0.4 01/15/2021 1442       Impression and Plan: Joanna Banks is a very pleasant 38 yo caucasian female with history of iron  deficiency anemia due to heavy cycles and GI blood loss.   Hgb is stable at 12.6.  Iron  studies pending. We will replace if needed.  Follow-up in 3 months.   Kennard Pea, NP 4/23/202511:18 AM

## 2024-04-17 ENCOUNTER — Telehealth: Payer: Self-pay

## 2024-04-17 NOTE — Telephone Encounter (Signed)
 Initial Comment Caller states her left eye appears to have a blood vessel burst. Her eye is red. there are times of blurry vision. This also happens when she gets migraines. Translation No Nurse Assessment Nurse: Clois, RN, Almarie Date/Time (Eastern Time): 04/17/2024 1:12:32 PM Confirm and document reason for call. If symptomatic, describe symptoms. ---Caller states that she woke up and her eye was red. States that it looks like a blood vessel is busted. States that she is having blurry vision at times as well. Does the patient have any new or worsening symptoms? ---Yes Will a triage be completed? ---Yes Related visit to physician within the last 2 weeks? ---No Does the PT have any chronic conditions? (i.e. diabetes, asthma, this includes High risk factors for pregnancy, etc.) ---Yes List chronic conditions. ---LS migraines Is the patient pregnant or possibly pregnant? (Ask all females between the ages of 47-55) ---No Is this a behavioral health or substance abuse call? ---No Guidelines Guideline Title Affirmed Question Affirmed Notes Nurse Date/Time Titus Time) Eye - Redness New or worsening blurred vision Cantrell, RN, Almarie 04/17/2024 1:13:42 PM Disp. Time Titus Time) Disposition Final User 04/17/2024 12:39:51 PM Attempt made - message left Pasty, RN, Sari PLEASE NOTE: All timestamps contained within this report are represented as [place] Standard Time. CONFIDENTIALTY NOTICE: This fax transmission is intended only for the addressee. It contains information that is legally privileged, confidential or otherwise protected from use or disclosure. If you are not the intended recipient, you are strictly prohibited from reviewing, disclosing, copying using or disseminating any of this information or taking any action in reliance on or regarding this information. If you have received this fax in error, please notify us  immediately by telephone so that we can  arrange for its return to us . Phone: [phone], Toll-Free: [phone], Fax: [phone] [facility] 09/29/1995 Page: 1 of2 CallId: [id number] Disp. Time Titus Time) Disposition Final User 04/17/2024 1:02:39 PM Attempt made - message left Pasty OBIE Sari 04/17/2024 1:17:14 PM See HCP within 4 Hours (or PCP triage) Yes Cantrell, RN, Almarie Final Disposition 04/17/2024 1:17:14 PM See HCP within 4 Hours (or PCP triage) Yes Cantrell, RN, Almarie [place] Disagree/Comply Comply Caller Understands Yes PreDisposition Home Care Care Advice Given Per Guideline SEE HCP (OR PCP TRIAGE) WITHIN 4 HOURS: * IF OFFICE WILL BE OPEN: You need to be seen within the next 3 or 4 hours. Call your doctor (or NP/PA) now or as soon as the office opens. CARE ADVICE given per Eye - Red without Pus (Adult) guideline. CALL BACK IF: * You become worse ANOTHER ADULT SHOULD DRIVE: * It is better and safer if another adult drives instead of you. Comments User: Almarie Clois, RN Date/Time Titus Time): 04/17/2024 1:23:44 PM Unable to call back line. Caller states that she had appointment at 4 pm but was wanting to make sure that it was not canceled. Called the main line verified that appointment was still there. User: Almarie Clois, RN Date/Time Titus Time): 04/17/2024 1:24:40 PM Advised caller that appointment was still there per office. Referrals REFERRED TO PCP OFFICE

## 2024-05-10 ENCOUNTER — Inpatient Hospital Stay: Admitting: Family

## 2024-05-10 ENCOUNTER — Encounter: Payer: Self-pay | Admitting: Family

## 2024-05-10 ENCOUNTER — Inpatient Hospital Stay: Attending: Family

## 2024-05-10 VITALS — BP 108/70 | HR 88 | Temp 99.6°F | Resp 17 | Wt 196.0 lb

## 2024-05-10 DIAGNOSIS — D500 Iron deficiency anemia secondary to blood loss (chronic): Secondary | ICD-10-CM | POA: Diagnosis present

## 2024-05-10 DIAGNOSIS — N920 Excessive and frequent menstruation with regular cycle: Secondary | ICD-10-CM | POA: Insufficient documentation

## 2024-05-10 LAB — IRON AND IRON BINDING CAPACITY (CC-WL,HP ONLY)
Iron: 53 ug/dL (ref 28–170)
Saturation Ratios: 14 % (ref 10.4–31.8)
TIBC: 388 ug/dL (ref 250–450)
UIBC: 335 ug/dL

## 2024-05-10 LAB — CBC WITH DIFFERENTIAL (CANCER CENTER ONLY)
Abs Immature Granulocytes: 0.11 K/uL — ABNORMAL HIGH (ref 0.00–0.07)
Basophils Absolute: 0.1 K/uL (ref 0.0–0.1)
Basophils Relative: 1 %
Eosinophils Absolute: 0.1 K/uL (ref 0.0–0.5)
Eosinophils Relative: 1 %
HCT: 41.1 % (ref 36.0–46.0)
Hemoglobin: 13.7 g/dL (ref 12.0–15.0)
Immature Granulocytes: 1 %
Lymphocytes Relative: 22 %
Lymphs Abs: 2.2 K/uL (ref 0.7–4.0)
MCH: 27.8 pg (ref 26.0–34.0)
MCHC: 33.3 g/dL (ref 30.0–36.0)
MCV: 83.4 fL (ref 80.0–100.0)
Monocytes Absolute: 0.5 K/uL (ref 0.1–1.0)
Monocytes Relative: 5 %
Neutro Abs: 7.1 K/uL (ref 1.7–7.7)
Neutrophils Relative %: 70 %
Platelet Count: 454 K/uL — ABNORMAL HIGH (ref 150–400)
RBC: 4.93 MIL/uL (ref 3.87–5.11)
RDW: 14.6 % (ref 11.5–15.5)
WBC Count: 10 K/uL (ref 4.0–10.5)
nRBC: 0 % (ref 0.0–0.2)

## 2024-05-10 LAB — FERRITIN: Ferritin: 18 ng/mL (ref 11–307)

## 2024-05-10 LAB — RETICULOCYTES
Immature Retic Fract: 4.3 % (ref 2.3–15.9)
RBC.: 5.02 MIL/uL (ref 3.87–5.11)
Retic Count, Absolute: 54.2 K/uL (ref 19.0–186.0)
Retic Ct Pct: 1.1 % (ref 0.4–3.1)

## 2024-05-10 NOTE — Progress Notes (Signed)
 Hematology and Oncology Follow Up Visit  Joanna Banks [id number] [birth year] 38 y.o. 05/10/2024   Principle Diagnosis:  Iron  deficiency anemia    Current Therapy:        Fusion plus oral iron  supplement daily IV iron  as indicated   Interim History:  Joanna Banks is here today for follow-up. She is doing well. She has had some mild SOB with exertion [place] but states that this resolved quickly with taking a break to rest.  Her cycle is regular with heavy flow the first 2 days or so. No clots noted.  No other blood loss. No abnormal bruising or petechiae.  No fever, chills, n/v, cough, rash, dizziness, chest pain, palpitations,abdominal pain or changes in bowel or bladder habits.  She has had some night sweats recently and is starting her cycle soon.  No swelling, tenderness, numbness or tingling in her extremities.  No falls or syncope reported.  Appetite and hydration are good. Weight is stable at 196 lbs. She is really working hard to continue losing weight. She feels she has plateaued for the last 6 month but she is hopeful it will start to drop again soon.   ECOG Performance Status: 1 - Symptomatic but completely ambulatory  Medications:  Allergies as of 05/10/2024       Reactions   Sulfa Antibiotics Hives        Medication List        Accurate as of May 10, 2024 10:31 AM. If you have any questions, ask your nurse or doctor.          Fusion Plus Caps Take 1 capsule by mouth daily.   metFORMIN  500 MG tablet Commonly known as: GLUCOPHAGE  Take 1 tablet (500 mg total) by mouth 2 (two) times daily with a meal.        Allergies:  Allergies  Allergen Reactions   Sulfa Antibiotics Hives    Past Medical History, Surgical history, Social history, and Family History were reviewed and updated.  Review of Systems: All other 10 point review of systems is negative.   Physical Exam:  vitals were not taken for this visit.   Wt Readings  from Last 3 Encounters:  02/09/24 200 lb (90.7 kg)  12/29/23 196 lb (88.9 kg)  12/24/23 202 lb 3.2 oz (91.7 kg)    Ocular: Sclerae unicteric, pupils equal, round and reactive to light Ear-nose-throat: Oropharynx clear, dentition fair Lymphatic: No cervical or supraclavicular adenopathy Lungs no rales or rhonchi, good excursion bilaterally Heart regular rate and rhythm, no murmur appreciated Abd soft, nontender, positive bowel sounds MSK no focal spinal tenderness, no joint edema Neuro: non-focal, well-oriented, appropriate affect Breasts: Deferred   Lab Results  Component Value Date   WBC 9.7 02/09/2024   HGB 12.6 02/09/2024   HCT 39.2 02/09/2024   MCV 80.5 02/09/2024   PLT 483 (H) 02/09/2024   Lab Results  Component Value Date   FERRITIN 13 02/09/2024   IRON  64 02/09/2024   TIBC 381 02/09/2024   UIBC 317 02/09/2024   IRONPCTSAT 17 02/09/2024   Lab Results  Component Value Date   RETICCTPCT 0.8 10/11/2023   RBC 4.87 02/09/2024   No results found for: KPAFRELGTCHN, LAMBDASER, KAPLAMBRATIO No results found for: IGGSERUM, IGA, IGMSERUM No results found for: TOTALPROTELP, ALBUMINELP, A1GS, A2GS, BETS, BETA2SER, GAMS, MSPIKE, SPEI   Chemistry      Component Value Date/Time   NA 139 03/30/2022 1057   K 4.8 03/30/2022 1057  CL 102 03/30/2022 1057   CO2 29 03/30/2022 1057   BUN 9 03/30/2022 1057   CREATININE 0.65 03/30/2022 1057   CREATININE 0.75 01/15/2021 1442   CREATININE 0.62 02/27/2019 1345      Component Value Date/Time   CALCIUM 9.8 03/30/2022 1057   ALKPHOS 55 03/30/2022 1057   AST 16 03/30/2022 1057   AST 27 01/15/2021 1442   ALT 19 03/30/2022 1057   ALT 23 01/15/2021 1442   BILITOT 0.4 03/30/2022 1057   BILITOT 0.4 01/15/2021 1442       Impression and Plan: Joanna Banks is a very pleasant 38 yo caucasian female with history of iron  deficiency anemia due to heavy cycles and GI blood loss.   Iron  studies pending. We will  replace if needed.  Follow-up in 4 months.   Lauraine Pepper, NP 7/23/202510:31 AM

## 2024-07-17 ENCOUNTER — Telehealth: Admitting: Family

## 2024-07-17 DIAGNOSIS — Z20818 Contact with and (suspected) exposure to other bacterial communicable diseases: Secondary | ICD-10-CM

## 2024-07-17 DIAGNOSIS — J029 Acute pharyngitis, unspecified: Secondary | ICD-10-CM

## 2024-07-18 MED ORDER — AMOXICILLIN 500 MG PO CAPS: 500.0000 mg | ORAL_CAPSULE | Freq: Two times a day (BID) | ORAL | 0 refills | Status: AC

## 2024-07-18 NOTE — Addendum Note (Signed)
 Addended by: LAVELL LYE A on: 07/18/2024 07:07 PM   Modules accepted: Orders

## 2024-07-18 NOTE — Progress Notes (Signed)
 E-Visit for Sore Throat - Strep Symptoms  We are sorry that you are not feeling well.  Here is how we plan to help!  Based on what you have shared with me it is likely that you have strep pharyngitis.  Strep pharyngitis is inflammation and infection in the back of the throat.  This is an infection cause by bacteria and is treated with antibiotics.  I have prescribed Amoxicillin  500 mg twice a day for 10 days. For throat pain, we recommend over the counter oral pain relief medications such as acetaminophen  or aspirin, or anti-inflammatory medications such as ibuprofen  or naproxen sodium. Topical treatments such as oral throat lozenges or sprays may be used as needed. Strep infections are not as easily transmitted as other respiratory infections, however we still recommend that you avoid close contact with loved ones, especially the very young and elderly.  Remember to wash your hands thoroughly throughout the day as this is the number one way to prevent the spread of infection and wipe down door knobs and counters with disinfectant.  For the stye, start applying warm compresses (warm, wet washcloth) to the area for 5-10 minutes a few times per day. This should help the stye drain and resolve but can sometimes take a week or so.   Home Care: Only take medications as instructed by your medical team. Complete the entire course of an antibiotic. Do not take these medications with alcohol. A steam or ultrasonic humidifier can help congestion.  You can place a towel over your head and breathe in the steam from hot water coming from a faucet. Avoid close contacts especially the very young and the elderly. Cover your mouth when you cough or sneeze. Always remember to wash your hands.  Get Help Right Away If: You develop worsening fever or sinus pain. You develop a severe head ache or visual changes. Your symptoms persist after you have completed your treatment plan.  Make sure you Understand these  instructions. Will watch your condition. Will get help right away if you are not doing well or get worse.   Thank you for choosing an e-visit.  Your e-visit answers were reviewed by a board certified advanced clinical practitioner to complete your personal care plan. Depending upon the condition, your plan could have included both over the counter or prescription medications.  Please review your pharmacy choice. Make sure the pharmacy is open so you can pick up prescription now. If there is a problem, you may contact your provider through [employer] and have the prescription routed to another pharmacy.  Your safety is important to us . If you have drug allergies check your prescription carefully.   For the next 24 hours you can use MyChart to ask questions about today's visit, request a non-urgent call back, or ask for a work or school excuse. You will get an email in the next two days asking about your experience. I hope that your e-visit has been valuable and will speed your recovery.

## 2024-07-18 NOTE — Progress Notes (Signed)
 I have spent 5 minutes in review of e-visit questionnaire, review and updating patient chart, medical decision making and response to patient.   Elsie Velma Lunger, PA-C

## 2024-09-11 ENCOUNTER — Inpatient Hospital Stay: Admitting: Family

## 2024-09-11 ENCOUNTER — Inpatient Hospital Stay

## 2024-09-13 ENCOUNTER — Inpatient Hospital Stay: Attending: Hematology & Oncology

## 2024-09-13 ENCOUNTER — Inpatient Hospital Stay: Admitting: Family

## 2024-09-13 VITALS — BP 110/69 | HR 70 | Temp 98.3°F | Resp 16 | Wt 190.8 lb

## 2024-09-13 DIAGNOSIS — D500 Iron deficiency anemia secondary to blood loss (chronic): Secondary | ICD-10-CM

## 2024-09-13 DIAGNOSIS — N920 Excessive and frequent menstruation with regular cycle: Secondary | ICD-10-CM | POA: Insufficient documentation

## 2024-09-13 LAB — CBC WITH DIFFERENTIAL (CANCER CENTER ONLY)
Abs Immature Granulocytes: 0.04 K/uL (ref 0.00–0.07)
Basophils Absolute: 0 K/uL (ref 0.0–0.1)
Basophils Relative: 0 %
Eosinophils Absolute: 0.1 K/uL (ref 0.0–0.5)
Eosinophils Relative: 1 %
HCT: 41.1 % (ref 36.0–46.0)
Hemoglobin: 13.7 g/dL (ref 12.0–15.0)
Immature Granulocytes: 0 %
Lymphocytes Relative: 21 %
Lymphs Abs: 2.1 K/uL (ref 0.7–4.0)
MCH: 28.2 pg (ref 26.0–34.0)
MCHC: 33.3 g/dL (ref 30.0–36.0)
MCV: 84.6 fL (ref 80.0–100.0)
Monocytes Absolute: 0.6 K/uL (ref 0.1–1.0)
Monocytes Relative: 6 %
Neutro Abs: 7.1 K/uL (ref 1.7–7.7)
Neutrophils Relative %: 72 %
Platelet Count: 416 K/uL — ABNORMAL HIGH (ref 150–400)
RBC: 4.86 MIL/uL (ref 3.87–5.11)
RDW: 13.9 % (ref 11.5–15.5)
WBC Count: 9.9 K/uL (ref 4.0–10.5)
nRBC: 0 % (ref 0.0–0.2)

## 2024-09-13 LAB — IRON AND IRON BINDING CAPACITY (CC-WL,HP ONLY)
Iron: 108 ug/dL (ref 28–170)
Saturation Ratios: 32 % — ABNORMAL HIGH (ref 10.4–31.8)
TIBC: 340 ug/dL (ref 250–450)
UIBC: 232 ug/dL

## 2024-09-13 LAB — RETICULOCYTES
Immature Retic Fract: 4.4 % (ref 2.3–15.9)
RBC.: 4.85 MIL/uL (ref 3.87–5.11)
Retic Count, Absolute: 44.6 K/uL (ref 19.0–186.0)
Retic Ct Pct: 0.9 % (ref 0.4–3.1)

## 2024-09-13 LAB — FERRITIN: Ferritin: 31 ng/mL (ref 11–307)

## 2024-09-13 NOTE — Progress Notes (Signed)
 Hematology and Oncology Follow Up Visit  Joanna Banks [id number] 26-Jul-1986 38 y.o. 09/13/2024   Principle Diagnosis:  Iron  deficiency anemia    Current Therapy:        Fusion plus oral iron  supplement daily IV iron  as indicated   Interim History:  Joanna Banks is here today for follow-up. She is doing well and has no complaints at this time.  She has some fatigue at times with normal daily stress.  Her cycle occurs around every 30 days and lasts for 4 days. The first two days flow is heavy.  No other blood loss noted. No abnormal bruising, no petechiae.  No fever, chills, n/v, cough, rash, dizziness, SOB, chest pain, palpitations, abdominal pain or changes in bowel or bladder habits.  No swelling, tenderness, numbness or tingling in her extremities.  No falls or syncope reported.  Appetite and hydration are good. Weight is stable at 190 lbs.   ECOG Performance Status: 1 - Symptomatic but completely ambulatory  Medications:  Allergies as of 09/13/2024       Reactions   Sulfa Antibiotics Hives        Medication List        Accurate as of September 13, 2024  1:30 PM. If you have any questions, ask your nurse or doctor.          STOP taking these medications    metFORMIN  500 MG tablet Commonly known as: GLUCOPHAGE  Stopped by: Lauraine Pepper       TAKE these medications    Fusion Plus Caps Take 1 capsule by mouth daily.        Allergies:  Allergies  Allergen Reactions   Sulfa Antibiotics Hives    Past Medical History, Surgical history, Social history, and Family History were reviewed and updated.  Review of Systems: All other 10 point review of systems is negative.   Physical Exam:  vitals were not taken for this visit.   Wt Readings from Last 3 Encounters:  05/10/24 196 lb (88.9 kg)  02/09/24 200 lb (90.7 kg)  12/29/23 196 lb (88.9 kg)    Ocular: Sclerae unicteric, pupils equal, round and reactive to light Ear-nose-throat: Oropharynx clear,  dentition fair Lymphatic: No cervical or supraclavicular adenopathy Lungs no rales or rhonchi, good excursion bilaterally Heart regular rate and rhythm, no murmur appreciated Abd soft, nontender, positive bowel sounds MSK no focal spinal tenderness, no joint edema Neuro: non-focal, well-oriented, appropriate affect Breasts: Deferred   Lab Results  Component Value Date   WBC 10.0 05/10/2024   HGB 13.7 05/10/2024   HCT 41.1 05/10/2024   MCV 83.4 05/10/2024   PLT 454 (H) 05/10/2024   Lab Results  Component Value Date   FERRITIN 18 05/10/2024   IRON  53 05/10/2024   TIBC 388 05/10/2024   UIBC 335 05/10/2024   IRONPCTSAT 14 05/10/2024   Lab Results  Component Value Date   RETICCTPCT 1.1 05/10/2024   RBC 5.02 05/10/2024   No results found for: KPAFRELGTCHN, LAMBDASER, KAPLAMBRATIO No results found for: IGGSERUM, IGA, IGMSERUM No results found for: STEPHANY RINGS, A1GS, A2GS, BETS, BETA2SER, GAMS, MSPIKE, SPEI   Chemistry      Component Value Date/Time   NA 139 03/30/2022 1057   K 4.8 03/30/2022 1057   CL 102 03/30/2022 1057   CO2 29 03/30/2022 1057   BUN 9 03/30/2022 1057   CREATININE 0.65 03/30/2022 1057   CREATININE 0.75 01/15/2021 1442   CREATININE 0.62 02/27/2019 1345      Component  Value Date/Time   CALCIUM 9.8 03/30/2022 1057   ALKPHOS 55 03/30/2022 1057   AST 16 03/30/2022 1057   AST 27 01/15/2021 1442   ALT 19 03/30/2022 1057   ALT 23 01/15/2021 1442   BILITOT 0.4 03/30/2022 1057   BILITOT 0.4 01/15/2021 1442       Impression and Plan: Joanna Banks is a very pleasant 38 yo caucasian female with history of iron  deficiency anemia due to heavy cycles and GI blood loss.   Iron  studies pending. We will replace if needed.  Follow-up in 4 months.   Lauraine Pepper, NP 11/26/20251:30 PM

## 2024-10-10 ENCOUNTER — Telehealth: Admitting: Physician Assistant

## 2024-10-10 DIAGNOSIS — R3989 Other symptoms and signs involving the genitourinary system: Secondary | ICD-10-CM

## 2024-10-10 MED ORDER — NITROFURANTOIN MONOHYD MACRO 100 MG PO CAPS: 100.0000 mg | ORAL_CAPSULE | Freq: Two times a day (BID) | ORAL | 0 refills | Status: AC

## 2024-10-10 NOTE — Progress Notes (Signed)

## 2024-10-11 ENCOUNTER — Encounter: Payer: Self-pay | Admitting: Medical

## 2024-10-13 ENCOUNTER — Encounter: Payer: Self-pay | Admitting: Obstetrics & Gynecology

## 2025-01-17 ENCOUNTER — Inpatient Hospital Stay: Admitting: Family

## 2025-01-17 ENCOUNTER — Inpatient Hospital Stay
